# Patient Record
Sex: Female | Born: 1960 | Race: White | Hispanic: No | Marital: Married | Smoking: Never smoker
Health system: Southern US, Community
[De-identification: ages and names within clinical notes are randomized; demographics above are authoritative.]

## PROBLEM LIST (undated history)

## (undated) DIAGNOSIS — E785 Hyperlipidemia, unspecified: Secondary | ICD-10-CM

## (undated) DIAGNOSIS — M25559 Pain in unspecified hip: Secondary | ICD-10-CM

## (undated) DIAGNOSIS — M549 Dorsalgia, unspecified: Secondary | ICD-10-CM

## (undated) HISTORY — PX: CHOLECYSTECTOMY: SHX55

## (undated) HISTORY — DX: Dorsalgia, unspecified: M54.9

## (undated) HISTORY — PX: HAND SURGERY: SHX662

## (undated) HISTORY — DX: Pain in unspecified hip: M25.559

---

## 2005-01-09 ENCOUNTER — Ambulatory Visit: Payer: Self-pay

## 2015-11-06 LAB — HEPATIC FUNCTION PANEL
ALK PHOS: 62 U/L (ref 25–125)
ALT: 17 U/L (ref 7–35)
AST: 22 U/L (ref 13–35)
BILIRUBIN, TOTAL: 0.3 mg/dL

## 2015-11-06 LAB — LIPID PANEL
Cholesterol: 191 mg/dL (ref 0–200)
HDL: 60 mg/dL (ref 35–70)
LDL CALC: 113 mg/dL
Triglycerides: 92 mg/dL (ref 40–160)

## 2015-11-06 LAB — BASIC METABOLIC PANEL
BUN: 13 mg/dL (ref 4–21)
CREATININE: 0.9 mg/dL (ref 0.5–1.1)
GLUCOSE: 87 mg/dL
Potassium: 4 mmol/L (ref 3.4–5.3)
SODIUM: 141 mmol/L (ref 137–147)

## 2015-11-06 LAB — CBC AND DIFFERENTIAL
HCT: 41 % (ref 36–46)
Hemoglobin: 14.5 g/dL (ref 12.0–16.0)
Neutrophils Absolute: 5 /uL
Platelets: 270 10*3/uL (ref 150–399)
WBC: 7.4 10^3/mL

## 2015-11-06 LAB — TSH: TSH: 2.05 u[IU]/mL (ref 0.41–5.90)

## 2015-11-06 LAB — HEMOGLOBIN A1C: Hemoglobin A1C: 5.5

## 2015-11-22 LAB — HM MAMMOGRAPHY

## 2016-04-07 ENCOUNTER — Encounter: Payer: Self-pay | Admitting: Family Medicine

## 2016-04-07 ENCOUNTER — Ambulatory Visit (INDEPENDENT_AMBULATORY_CARE_PROVIDER_SITE_OTHER): Payer: 59 | Admitting: Family Medicine

## 2016-04-07 VITALS — BP 104/64 | HR 68 | Temp 98.1°F | Ht 65.0 in | Wt 141.2 lb

## 2016-04-07 DIAGNOSIS — Z0001 Encounter for general adult medical examination with abnormal findings: Secondary | ICD-10-CM

## 2016-04-07 DIAGNOSIS — M25551 Pain in right hip: Secondary | ICD-10-CM | POA: Diagnosis not present

## 2016-04-07 DIAGNOSIS — R6889 Other general symptoms and signs: Secondary | ICD-10-CM | POA: Diagnosis not present

## 2016-04-07 MED ORDER — PREDNISONE 10 MG (48) PO TBPK: ORAL_TABLET | Freq: Every day | ORAL | 0 refills | Status: DC

## 2016-04-07 NOTE — Progress Notes (Signed)
Pre visit review using our clinic review tool, if applicable. No additional management support is needed unless otherwise documented below in the visit note. 

## 2016-04-07 NOTE — Patient Instructions (Signed)
Follow up with me if it persists. Otherwise follow up annually.  It was nice to see you today.  Take care  Dr. Lacinda Axon  Health Maintenance, Female Adopting a healthy lifestyle and getting preventive care can go a long way to promote health and wellness. Talk with your health care provider about what schedule of regular examinations is right for you. This is a good chance for you to check in with your provider about disease prevention and staying healthy. In between checkups, there are plenty of things you can do on your own. Experts have done a lot of research about which lifestyle changes and preventive measures are most likely to keep you healthy. Ask your health care provider for more information. WEIGHT AND DIET  Eat a healthy diet  Be sure to include plenty of vegetables, fruits, low-fat dairy products, and lean protein.  Do not eat a lot of foods high in solid fats, added sugars, or salt.  Get regular exercise. This is one of the most important things you can do for your health.  Most adults should exercise for at least 150 minutes each week. The exercise should increase your heart rate and make you sweat (moderate-intensity exercise).  Most adults should also do strengthening exercises at least twice a week. This is in addition to the moderate-intensity exercise.  Maintain a healthy weight  Body mass index (BMI) is a measurement that can be used to identify possible weight problems. It estimates body fat based on height and weight. Your health care provider can help determine your BMI and help you achieve or maintain a healthy weight.  For females 12 years of age and older:   A BMI below 18.5 is considered underweight.  A BMI of 18.5 to 24.9 is normal.  A BMI of 25 to 29.9 is considered overweight.  A BMI of 30 and above is considered obese.  Watch levels of cholesterol and blood lipids  You should start having your blood tested for lipids and cholesterol at 55 years of  age, then have this test every 5 years.  You may need to have your cholesterol levels checked more often if:  Your lipid or cholesterol levels are high.  You are older than 56 years of age.  You are at high risk for heart disease.  CANCER SCREENING   Lung Cancer  Lung cancer screening is recommended for adults 94-32 years old who are at high risk for lung cancer because of a history of smoking.  A yearly low-dose CT scan of the lungs is recommended for people who:  Currently smoke.  Have quit within the past 15 years.  Have at least a 30-pack-year history of smoking. A pack year is smoking an average of one pack of cigarettes a day for 1 year.  Yearly screening should continue until it has been 15 years since you quit.  Yearly screening should stop if you develop a health problem that would prevent you from having lung cancer treatment.  Breast Cancer  Practice breast self-awareness. This means understanding how your breasts normally appear and feel.  It also means doing regular breast self-exams. Let your health care provider know about any changes, no matter how small.  If you are in your 20s or 30s, you should have a clinical breast exam (CBE) by a health care provider every 1-3 years as part of a regular health exam.  If you are 49 or older, have a CBE every year. Also consider having a breast  X-ray (mammogram) every year.  If you have a family history of breast cancer, talk to your health care provider about genetic screening.  If you are at high risk for breast cancer, talk to your health care provider about having an MRI and a mammogram every year.  Breast cancer gene (BRCA) assessment is recommended for women who have family members with BRCA-related cancers. BRCA-related cancers include:  Breast.  Ovarian.  Tubal.  Peritoneal cancers.  Results of the assessment will determine the need for genetic counseling and BRCA1 and BRCA2 testing. Cervical  Cancer Your health care provider may recommend that you be screened regularly for cancer of the pelvic organs (ovaries, uterus, and vagina). This screening involves a pelvic examination, including checking for microscopic changes to the surface of your cervix (Pap test). You may be encouraged to have this screening done every 3 years, beginning at age 59.  For women ages 49-65, health care providers may recommend pelvic exams and Pap testing every 3 years, or they may recommend the Pap and pelvic exam, combined with testing for human papilloma virus (HPV), every 5 years. Some types of HPV increase your risk of cervical cancer. Testing for HPV may also be done on women of any age with unclear Pap test results.  Other health care providers may not recommend any screening for nonpregnant women who are considered low risk for pelvic cancer and who do not have symptoms. Ask your health care provider if a screening pelvic exam is right for you.  If you have had past treatment for cervical cancer or a condition that could lead to cancer, you need Pap tests and screening for cancer for at least 20 years after your treatment. If Pap tests have been discontinued, your risk factors (such as having a new sexual partner) need to be reassessed to determine if screening should resume. Some women have medical problems that increase the chance of getting cervical cancer. In these cases, your health care provider may recommend more frequent screening and Pap tests. Colorectal Cancer  This type of cancer can be detected and often prevented.  Routine colorectal cancer screening usually begins at 55 years of age and continues through 55 years of age.  Your health care provider may recommend screening at an earlier age if you have risk factors for colon cancer.  Your health care provider may also recommend using home test kits to check for hidden blood in the stool.  A small camera at the end of a tube can be used to  examine your colon directly (sigmoidoscopy or colonoscopy). This is done to check for the earliest forms of colorectal cancer.  Routine screening usually begins at age 12.  Direct examination of the colon should be repeated every 5-10 years through 55 years of age. However, you may need to be screened more often if early forms of precancerous polyps or small growths are found. Skin Cancer  Check your skin from head to toe regularly.  Tell your health care provider about any new moles or changes in moles, especially if there is a change in a mole's shape or color.  Also tell your health care provider if you have a mole that is larger than the size of a pencil eraser.  Always use sunscreen. Apply sunscreen liberally and repeatedly throughout the day.  Protect yourself by wearing long sleeves, pants, a wide-brimmed hat, and sunglasses whenever you are outside. HEART DISEASE, DIABETES, AND HIGH BLOOD PRESSURE   High blood pressure causes heart  disease and increases the risk of stroke. High blood pressure is more likely to develop in:  People who have blood pressure in the high end of the normal range (130-139/85-89 mm Hg).  People who are overweight or obese.  People who are African American.  If you are 46-25 years of age, have your blood pressure checked every 3-5 years. If you are 69 years of age or older, have your blood pressure checked every year. You should have your blood pressure measured twice--once when you are at a hospital or clinic, and once when you are not at a hospital or clinic. Record the average of the two measurements. To check your blood pressure when you are not at a hospital or clinic, you can use:  An automated blood pressure machine at a pharmacy.  A home blood pressure monitor.  If you are between 82 years and 75 years old, ask your health care provider if you should take aspirin to prevent strokes.  Have regular diabetes screenings. This involves taking a  blood sample to check your fasting blood sugar level.  If you are at a normal weight and have a low risk for diabetes, have this test once every three years after 55 years of age.  If you are overweight and have a high risk for diabetes, consider being tested at a younger age or more often. PREVENTING INFECTION  Hepatitis B  If you have a higher risk for hepatitis B, you should be screened for this virus. You are considered at high risk for hepatitis B if:  You were born in a country where hepatitis B is common. Ask your health care provider which countries are considered high risk.  Your parents were born in a high-risk country, and you have not been immunized against hepatitis B (hepatitis B vaccine).  You have HIV or AIDS.  You use needles to inject street drugs.  You live with someone who has hepatitis B.  You have had sex with someone who has hepatitis B.  You get hemodialysis treatment.  You take certain medicines for conditions, including cancer, organ transplantation, and autoimmune conditions. Hepatitis C  Blood testing is recommended for:  Everyone born from [birth year] through 1965.  Anyone with known risk factors for hepatitis C. Sexually transmitted infections (STIs)  You should be screened for sexually transmitted infections (STIs) including gonorrhea and chlamydia if:  You are sexually active and are younger than 55 years of age.  You are older than 55 years of age and your health care provider tells you that you are at risk for this type of infection.  Your sexual activity has changed since you were last screened and you are at an increased risk for chlamydia or gonorrhea. Ask your health care provider if you are at risk.  If you do not have HIV, but are at risk, it may be recommended that you take a prescription medicine daily to prevent HIV infection. This is called pre-exposure prophylaxis (PrEP). You are considered at risk if:  You are sexually active and do  not regularly use condoms or know the HIV status of your partner(s).  You take drugs by injection.  You are sexually active with a partner who has HIV. Talk with your health care provider about whether you are at high risk of being infected with HIV. If you choose to begin PrEP, you should first be tested for HIV. You should then be tested every 3 months for as long as you are taking  PrEP.  PREGNANCY   If you are premenopausal and you may become pregnant, ask your health care provider about preconception counseling.  If you may become pregnant, take 400 to 800 micrograms (mcg) of folic acid every day.  If you want to prevent pregnancy, talk to your health care provider about birth control (contraception). OSTEOPOROSIS AND MENOPAUSE   Osteoporosis is a disease in which the bones lose minerals and strength with aging. This can result in serious bone fractures. Your risk for osteoporosis can be identified using a bone density scan.  If you are 12 years of age or older, or if you are at risk for osteoporosis and fractures, ask your health care provider if you should be screened.  Ask your health care provider whether you should take a calcium or vitamin D supplement to lower your risk for osteoporosis.  Menopause may have certain physical symptoms and risks.  Hormone replacement therapy may reduce some of these symptoms and risks. Talk to your health care provider about whether hormone replacement therapy is right for you.  HOME CARE INSTRUCTIONS   Schedule regular health, dental, and eye exams.  Stay current with your immunizations.   Do not use any tobacco products including cigarettes, chewing tobacco, or electronic cigarettes.  If you are pregnant, do not drink alcohol.  If you are breastfeeding, limit how much and how often you drink alcohol.  Limit alcohol intake to no more than 1 drink per day for nonpregnant women. One drink equals 12 ounces of beer, 5 ounces of wine, or 1  ounces of hard liquor.  Do not use street drugs.  Do not share needles.  Ask your health care provider for help if you need support or information about quitting drugs.  Tell your health care provider if you often feel depressed.  Tell your health care provider if you have ever been abused or do not feel safe at home.   This information is not intended to replace advice given to you by your health care provider. Make sure you discuss any questions you have with your health care provider.   Document Released: 03/10/2011 Document Revised: 09/15/2014 Document Reviewed: 07/27/2013 Elsevier Interactive Patient Education [employer].

## 2016-04-07 NOTE — Progress Notes (Signed)
Subjective:  Patient ID: Taylor Fisher, female    DOB: 1960/12/22  Age: 55 y.o. MRN: [id number]  CC: Establish care, Hip pain  HPI Taylor Fisher is a 55 y.o. female presents to the clinic today to establish care.  Preventative Healthcare  Pap smear: Up to date.   Mammogram: Up to date.  Colonoscopy: Up to date.   Immunizations  Tetanus - In need of. Wants to wait.  Hepatitis C screening - Candidate for.  Labs: Has had annual labs at GYN. Will request records.  Exercise: Works outside.  Alcohol use: See below.  Smoking/tobacco use: Nonsmoker.  STD/HIV testing: Candidate for.  Regular dental exams: Yes.   Wears seat belt: Yes.   R Hip pain  Located laterally.  Does have tenderness over the trochanter (mild).  Has been persistent for the past 3 months.  No reported fall, trauma, injury.  No groin pain.  Pain is mild to severe at times.  Described as a dull ache.  Worse at night.  No relief with visits to the chiropractor, Aleve, BC powder.  No reports of back pain or radicular symptoms.  PMH, Surgical Hx, Family Hx, Social History reviewed and updated as below.  Past Medical History:  Diagnosis Date  . Back pain   . Hip pain    Past Surgical History:  Procedure Laterality Date  . CESAREAN SECTION     x 2  . CHOLECYSTECTOMY     Family History  Problem Relation Age of Onset  . Kidney Stones Father   . Colon cancer Maternal Grandfather    Social History  Substance Use Topics  . Smoking status: Never Smoker  . Smokeless tobacco: Never Used  . Alcohol use Yes     Comment: occassionally   Review of Systems  Musculoskeletal:       Right hip pain.  All other systems reviewed and are negative.  Objective:   Today's Vitals: BP 104/64   Pulse 68   Temp 98.1 F (36.7 C) (Oral)   Ht 5\' 5"  (1.651 m)   Wt 141 lb 4 oz (64.1 kg)   LMP  (LMP Unknown)   SpO2 98%   BMI 23.51 kg/m   Physical Exam  Constitutional: She is oriented to  person, place, and time. She appears well-developed and well-nourished. No distress.  HENT:  Head: Normocephalic and atraumatic.  Nose: Nose normal.  Mouth/Throat: Oropharynx is clear and moist. No oropharyngeal exudate.  Normal TM's bilaterally.   Eyes: Conjunctivae are normal. No scleral icterus.  Neck: Neck supple. No thyromegaly present.  Cardiovascular: Normal rate and regular rhythm.   No murmur heard. Pulmonary/Chest: Effort normal and breath sounds normal. She has no wheezes. She has no rales.  Abdominal: Soft. She exhibits no distension. There is no tenderness. There is no rebound and no guarding.  Musculoskeletal:  Hip: Right ROM - Slight limitation in external rotation. Greater trochanter with mild tenderness.  No pain with FABER or FADIR.  Negative straight leg raise.  Right hand - Only thumb is present. Patient had lawnmower accident and lost digits.  Lymphadenopathy:    She has no cervical adenopathy.  Neurological: She is alert and oriented to person, place, and time.  Skin: Skin is warm and dry. No rash noted.  Psychiatric: She has a normal mood and affect.  Vitals reviewed.  Assessment & Plan:   Problem List Items Addressed This Visit    Encounter for preventative adult health care exam with abnormal findings  Pap smear mammogram up-to-date. Colonoscopy up-to-date. Patient wants to wait on Tdap. We'll request records from OB/GYN regarding labs.      Right hip pain    New problem. Appears to be MSK in nature; possible mild trochanteric bursitis. Discussed treatment options today including NSAIDs, oral steroids, injection. Will treat with course of prednisone. Follow up if persists.       Other Visit Diagnoses   None.     Outpatient Encounter Prescriptions as of 04/07/2016  Medication Sig  . aspirin 81 MG tablet Take 81 mg by mouth daily.  . Calcium Carbonate-Vitamin D (CALTRATE 600+D PO) Take by mouth daily.  . ferrous sulfate 325 (65 FE) MG  tablet Take 325 mg by mouth daily with breakfast.  . Multiple Vitamins-Minerals (CENTRUM SILVER PO) Take by mouth daily.  . norethindrone-ethinyl estradiol (FEMHRT LOW DOSE) 0.5-2.5 MG-MCG tablet Take 1 tablet by mouth daily.  . Omega-3 Fatty Acids (FISH OIL) 1200 MG CAPS Take by mouth daily.  . vitamin B-12 (CYANOCOBALAMIN) 500 MCG tablet Take 500 mcg by mouth daily.  . predniSONE (STERAPRED UNI-PAK 48 TAB) 10 MG (48) TBPK tablet Take by mouth daily. Per package instructions.   No facility-administered encounter medications on file as of 04/07/2016.     Follow-up: Annually or sooner if needed.  Everlene Other DO [facility]

## 2016-04-07 NOTE — Assessment & Plan Note (Signed)
Pap smear mammogram up-to-date. Colonoscopy up-to-date. Patient wants to wait on Tdap. We'll request records from OB/GYN regarding labs.

## 2016-04-07 NOTE — Assessment & Plan Note (Signed)
New problem. Appears to be MSK in nature; possible mild trochanteric bursitis. Discussed treatment options today including NSAIDs, oral steroids, injection. Will treat with course of prednisone. Follow up if persists.

## 2016-04-10 ENCOUNTER — Telehealth: Payer: Self-pay | Admitting: *Deleted

## 2016-04-10 NOTE — Telephone Encounter (Signed)
Order states take one per day, follow package instructions, please advise is this correct prior to me calling patient, thanks

## 2016-04-10 NOTE — Telephone Encounter (Signed)
Advised patient left a detailed message, thanks

## 2016-04-10 NOTE — Telephone Encounter (Signed)
This is a sterpred pack. Take per package instructions.

## 2016-04-10 NOTE — Telephone Encounter (Signed)
Patient stated that her prednisone Rx had a different direction than what Dr Adriana Simas advised. She requested a call to clarify her dosage for this medication. She was advised to take one tablet daily.  Pt contact [phone]  A message can be left on voicemail

## 2016-04-17 ENCOUNTER — Ambulatory Visit (INDEPENDENT_AMBULATORY_CARE_PROVIDER_SITE_OTHER): Payer: 59 | Admitting: Family Medicine

## 2016-04-17 ENCOUNTER — Encounter (INDEPENDENT_AMBULATORY_CARE_PROVIDER_SITE_OTHER): Payer: Self-pay

## 2016-04-17 ENCOUNTER — Encounter: Payer: Self-pay | Admitting: Family Medicine

## 2016-04-17 DIAGNOSIS — M25551 Pain in right hip: Secondary | ICD-10-CM | POA: Diagnosis not present

## 2016-04-17 DIAGNOSIS — R5383 Other fatigue: Secondary | ICD-10-CM | POA: Diagnosis not present

## 2016-04-17 DIAGNOSIS — F329 Major depressive disorder, single episode, unspecified: Secondary | ICD-10-CM

## 2016-04-17 NOTE — Assessment & Plan Note (Signed)
New problem. Appears to be secondary to underlying mild depression. Offered medication and patient declined. Will continue to monitor closely.

## 2016-04-17 NOTE — Assessment & Plan Note (Signed)
Established problem, improving. Advised to finish course of prednisone. Exercises given today. Follow up as needed. If persists, will proceed with injection.

## 2016-04-17 NOTE — Patient Instructions (Signed)
If your hip continues to bother you let me know.  Follow up annually or sooner if needed.  Take care  Dr. Adriana Simas   Generic Hip Exercises RANGE OF MOTION (ROM) AND STRETCHING EXERCISES  These exercises may help you when beginning to rehabilitate your injury. Doing them too aggressively can worsen your condition. Complete them slowly and gently. Your symptoms may resolve with or without further involvement from your physician, physical therapist or athletic trainer. While completing these exercises, remember:   Restoring tissue flexibility helps normal motion to return to the joints. This allows healthier, less painful movement and activity.  An effective stretch should be held for at least 30 seconds.  A stretch should never be painful. You should only feel a gentle lengthening or release in the stretched tissue. If these stretches worsen your symptoms even when done gently, consult your physician, physical therapist or athletic trainer. STRETCH - Hamstrings, Supine   Lie on your back. Loop a belt or towel over the ball of your right / left foot.  Straighten your right / left knee and slowly pull on the belt to raise your leg. Do not allow the right / left knee to bend. Keep your opposite leg flat on the floor.  Raise the leg until you feel a gentle stretch behind your right / left knee or thigh. Hold this position for __________ seconds. Repeat __________ times. Complete this stretch __________ times per day.  STRETCH - Hip Rotators   Lie on your back on a firm surface. Grasp your right / left knee with your right / left hand and your ankle with your opposite hand.  Keeping your hips and shoulders firmly planted, gently pull your right / left knee and rotate your lower leg toward your opposite shoulder until you feel a stretch in your buttocks.  Hold this stretch for __________ seconds. Repeat this stretch __________ times. Complete this stretch __________ times per day. STRETCH -  Hamstrings/Adductors, V-Sit   Sit on the floor with your legs extended in a large "V," keeping your knees straight.  With your head and chest upright, bend at your waist reaching for your right foot to stretch your left adductors.  You should feel a stretch in your left inner thigh. Hold for __________ seconds.  Return to the upright position to relax your leg muscles.  Continuing to keep your chest upright, bend straight forward at your waist to stretch your hamstrings.  You should feel a stretch behind both of your thighs and/or knees. Hold for __________ seconds.  Return to the upright position to relax your leg muscles.  Repeat steps 2 through 4 for opposite leg. Repeat __________ times. Complete this exercise __________ times per day.  STRETCHING - Hip Flexors, Lunge  Half kneel with your right / left knee on the floor and your opposite knee bent and directly over your ankle.  Keep good posture with your head over your shoulders. Tighten your buttocks to point your tailbone downward; this will prevent your back from arching too much.  You should feel a gentle stretch in the front of your thigh and/or hip. If you do not feel any resistance, slightly slide your opposite foot forward and then slowly lunge forward so your knee once again lines up over your ankle. Be sure your tailbone remains pointed downward.  Hold this stretch for __________ seconds. Repeat __________ times. Complete this stretch __________ times per day. STRENGTHENING EXERCISES These exercises may help you when beginning to rehabilitate your  injury. They may resolve your symptoms with or without further involvement from your physician, physical therapist or athletic trainer. While completing these exercises, remember:   Muscles can gain both the endurance and the strength needed for everyday activities through controlled exercises.  Complete these exercises as instructed by your physician, physical therapist or  athletic trainer. Progress the resistance and repetitions only as guided.  You may experience muscle soreness or fatigue, but the pain or discomfort you are trying to eliminate should never worsen during these exercises. If this pain does worsen, stop and make certain you are following the directions exactly. If the pain is still present after adjustments, discontinue the exercise until you can discuss the trouble with your clinician. STRENGTH - Hip Extensors, Bridge   Lie on your back on a firm surface. Bend your knees and place your feet flat on the floor.  Tighten your buttocks muscles and lift your bottom off the floor until your trunk is level with your thighs. You should feel the muscles in your buttocks and back of your thighs working. If you do not feel these muscles, slide your feet 1-2 inches further away from your buttocks.  Hold this position for __________ seconds.  Slowly lower your hips to the starting position and allow your buttock muscles relax completely before beginning the next repetition.  If this exercise is too easy, you may cross your arms over your chest. Repeat __________ times. Complete this exercise __________ times per day.  STRENGTH - Hip Abductors, Straight Leg Raises  Be aware of your form throughout the entire exercise so that you exercise the correct muscles. Sloppy form means that you are not strengthening the correct muscles.  Lie on your side so that your head, shoulders, knee and hip line up. You may bend your lower knee to help maintain your balance. Your right / left leg should be on top.  Roll your hips slightly forward, so that your hips are stacked directly over each other and your right / left knee is facing forward.  Lift your top leg up 4-6 inches, leading with your heel. Be sure that your foot does not drift forward or that your knee does not roll toward the ceiling.  Hold this position for __________ seconds. You should feel the muscles in your  outer hip lifting (you may not notice this until your leg begins to tire).  Slowly lower your leg to the starting position. Allow the muscles to fully relax before beginning the next repetition. Repeat __________ times. Complete this exercise __________ times per day.  STRENGTH - Hip Adductors, Straight Leg Raises   Lie on your side so that your head, shoulders, knee and hip line up. You may place your upper foot in front to help maintain your balance. Your right / left leg should be on the bottom.  Roll your hips slightly forward, so that your hips are stacked directly over each other and your right / left knee is facing forward.  Tense the muscles in your inner thigh and lift your bottom leg 4-6 inches. Hold this position for __________ seconds.  Slowly lower your leg to the starting position. Allow the muscles to fully relax before beginning the next repetition. Repeat __________ times. Complete this exercise __________ times per day.  STRENGTH - Quadriceps, Straight Leg Raises  Quality counts! Watch for signs that the quadriceps muscle is working to insure you are strengthening the correct muscles and not "cheating" by substituting with healthier muscles.  Lay  on your back with your right / left leg extended and your opposite knee bent.  Tense the muscles in the front of your right / left thigh. You should see either your knee cap slide up or increased dimpling just above the knee. Your thigh may even quiver.  Tighten these muscles even more and raise your leg 4 to 6 inches off the floor. Hold for right / left seconds.  Keeping these muscles tense, lower your leg.  Relax the muscles slowly and completely in between each repetition. Repeat __________ times. Complete this exercise __________ times per day.  STRENGTH - Hip Abductors, Standing  Tie one end of a rubber exercise band/tubing to a secure surface (table, pole) and tie a loop at the other end.  Place the loop around your  right / left ankle. Keeping your ankle with the band directly opposite of the secured end, step away until there is tension in the tube/band.  Hold onto a chair as needed for balance.  Keeping your back upright, your shoulders over your hips, and your toes pointing forward, lift your right / left leg out to your side. Be sure to lift your leg with your hip muscles. Do not "throw" your leg or tip your body to lift your leg.  Slowly and with control, return to the starting position. Repeat exercise __________ times. Complete this exercise __________ times per day.  STRENGTH - Quadriceps, Squats  Stand in a door frame so that your feet and knees are in line with the frame.  Use your hands for balance, not support, on the frame.  Slowly lower your weight, bending at the hips and knees. Keep your lower legs upright so that they are parallel with the door frame. Squat only within the range that does not increase your knee pain. Never let your hips drop below your knees.  Slowly return upright, pushing with your legs, not pulling with your hands.   This information is not intended to replace advice given to you by your health care provider. Make sure you discuss any questions you have with your health care provider.   Document Released: 09/12/2005 Document Revised: 09/15/2014 Document Reviewed: 12/07/2008 Elsevier Interactive Patient Education [employer]

## 2016-04-17 NOTE — Progress Notes (Signed)
Pre visit review using our clinic review tool, if applicable. No additional management support is needed unless otherwise documented below in the visit note. 

## 2016-04-17 NOTE — Progress Notes (Signed)
   Subjective:  Patient ID: Taylor Fisher, female    DOB: [birth year]  Age: 55 y.o. MRN: [id number]  CC: R hip pain  HPI:  55 year old female presents for follow-up regarding her right hip pain.  Patient reports that her right hip pain has improved but is still mildly troublesome. Pain is improved dramatically since being on the prednisone. Pain is located at the greater trochanter. No known exacerbating factors.  Additionally, patient reports she has been experiencing fatigue and decreased energy. This is been going on for quite some time. She is concerned about this and would like to discuss it further today. She has noticed an improvement in her energy level since taking the prednisone. No new stressors or life events, although she has lost family members in the recent past. No issues at home. She tries to be active but does not exercise regularly. No other complaints this time.  Social Hx   Social History   Social History  . Marital status: Married    Spouse name: N/A  . Number of children: N/A  . Years of education: N/A   Social History Main Topics  . Smoking status: Never Smoker  . Smokeless tobacco: Never Used  . Alcohol use Yes     Comment: occassionally  . Drug use: Unknown  . Sexual activity: Not Asked   Other Topics Concern  . None   Social History Narrative  . None   Review of Systems  Constitutional: Positive for fatigue.  Musculoskeletal:       Right hip pain.    Objective:  BP 133/80 (BP Location: Right Arm, Patient Position: Sitting, Cuff Size: Normal)   Pulse (!) 110   Temp 98.1 F (36.7 C) (Oral)   Wt 141 lb 8 oz (64.2 kg)   LMP  (LMP Unknown)   SpO2 96%   BMI 23.55 kg/m   BP/Weight 04/17/2016 04/07/2016  Systolic BP 133 104  Diastolic BP 80 64  Wt. (Lbs) 141.5 141.25  BMI 23.55 23.51   Physical Exam  Constitutional: She is oriented to person, place, and time. She appears well-developed. No distress.  Pulmonary/Chest: Effort normal.    Musculoskeletal:  Right hip - Mild tenderness at the greater trochanter.  Neurological: She is alert and oriented to person, place, and time.  Psychiatric: She has a normal mood and affect.  Vitals reviewed.  Assessment & Plan:   Problem List Items Addressed This Visit    Fatigue    New problem. Appears to be secondary to underlying mild depression. Offered medication and patient declined. Will continue to monitor closely.      Right hip pain    Established problem, improving. Advised to finish course of prednisone. Exercises given today. Follow up as needed. If persists, will proceed with injection.       Other Visit Diagnoses   None.    Follow-up: PRN  Everlene OtherJayce Christobal Morado DO [facility] [place]

## 2016-05-08 ENCOUNTER — Encounter: Payer: Self-pay | Admitting: Family Medicine

## 2017-11-11 ENCOUNTER — Other Ambulatory Visit: Payer: Self-pay | Admitting: Obstetrics & Gynecology

## 2017-11-11 DIAGNOSIS — Z1231 Encounter for screening mammogram for malignant neoplasm of breast: Secondary | ICD-10-CM

## 2017-12-29 ENCOUNTER — Ambulatory Visit
Admission: RE | Admit: 2017-12-29 | Discharge: 2017-12-29 | Disposition: A | Discharge status: Home / Self Care | Payer: 59 | Source: Ambulatory Visit | Attending: Obstetrics & Gynecology | Admitting: Obstetrics & Gynecology

## 2017-12-29 ENCOUNTER — Encounter: Payer: Self-pay | Admitting: Radiology

## 2017-12-29 DIAGNOSIS — Z1231 Encounter for screening mammogram for malignant neoplasm of breast: Secondary | ICD-10-CM | POA: Diagnosis not present

## 2018-11-16 ENCOUNTER — Other Ambulatory Visit: Payer: Self-pay | Admitting: Obstetrics & Gynecology

## 2018-11-16 DIAGNOSIS — Z1231 Encounter for screening mammogram for malignant neoplasm of breast: Secondary | ICD-10-CM

## 2019-03-01 ENCOUNTER — Ambulatory Visit
Admission: RE | Admit: 2019-03-01 | Discharge: 2019-03-01 | Disposition: A | Discharge status: Home / Self Care | Payer: 59 | Source: Ambulatory Visit | Attending: Obstetrics & Gynecology | Admitting: Obstetrics & Gynecology

## 2019-03-01 ENCOUNTER — Other Ambulatory Visit: Payer: Self-pay

## 2019-03-01 DIAGNOSIS — Z1231 Encounter for screening mammogram for malignant neoplasm of breast: Secondary | ICD-10-CM | POA: Diagnosis present

## 2020-01-12 DIAGNOSIS — M21942 Unspecified acquired deformity of hand, left hand: Secondary | ICD-10-CM | POA: Insufficient documentation

## 2020-01-12 DIAGNOSIS — Z1159 Encounter for screening for other viral diseases: Secondary | ICD-10-CM | POA: Insufficient documentation

## 2020-03-27 ENCOUNTER — Other Ambulatory Visit: Payer: Self-pay | Admitting: Obstetrics & Gynecology

## 2020-03-27 DIAGNOSIS — Z1231 Encounter for screening mammogram for malignant neoplasm of breast: Secondary | ICD-10-CM

## 2020-04-17 ENCOUNTER — Other Ambulatory Visit: Payer: Self-pay

## 2020-04-17 ENCOUNTER — Ambulatory Visit
Admission: RE | Admit: 2020-04-17 | Discharge: 2020-04-17 | Disposition: A | Discharge status: Home / Self Care | Payer: 59 | Source: Ambulatory Visit | Attending: Obstetrics & Gynecology | Admitting: Obstetrics & Gynecology

## 2020-04-17 DIAGNOSIS — Z1231 Encounter for screening mammogram for malignant neoplasm of breast: Secondary | ICD-10-CM | POA: Insufficient documentation

## 2020-04-20 ENCOUNTER — Other Ambulatory Visit: Payer: Self-pay | Admitting: Obstetrics & Gynecology

## 2020-04-20 DIAGNOSIS — R928 Other abnormal and inconclusive findings on diagnostic imaging of breast: Secondary | ICD-10-CM

## 2020-05-03 ENCOUNTER — Other Ambulatory Visit: Payer: Self-pay

## 2020-05-03 ENCOUNTER — Ambulatory Visit
Admission: RE | Admit: 2020-05-03 | Discharge: 2020-05-03 | Disposition: A | Discharge status: Home / Self Care | Payer: 59 | Source: Ambulatory Visit | Attending: Obstetrics & Gynecology | Admitting: Obstetrics & Gynecology

## 2020-05-03 DIAGNOSIS — R928 Other abnormal and inconclusive findings on diagnostic imaging of breast: Secondary | ICD-10-CM | POA: Diagnosis not present

## 2020-12-27 ENCOUNTER — Encounter: Payer: Self-pay | Admitting: Gastroenterology

## 2020-12-28 ENCOUNTER — Other Ambulatory Visit: Payer: Self-pay

## 2021-02-11 ENCOUNTER — Encounter: Payer: Self-pay | Admitting: Gastroenterology

## 2021-02-11 ENCOUNTER — Other Ambulatory Visit: Payer: Self-pay

## 2021-02-11 ENCOUNTER — Ambulatory Visit (INDEPENDENT_AMBULATORY_CARE_PROVIDER_SITE_OTHER): Payer: 59 | Admitting: Gastroenterology

## 2021-02-11 VITALS — BP 120/57 | HR 86 | Temp 97.9°F | Ht 65.0 in | Wt 141.2 lb

## 2021-02-11 DIAGNOSIS — R195 Other fecal abnormalities: Secondary | ICD-10-CM

## 2021-02-11 MED ORDER — NA SULFATE-K SULFATE-MG SULF 17.5-3.13-1.6 GM/177ML PO SOLN: 354.0000 mL | Freq: Once | ORAL | 0 refills | Status: AC

## 2021-02-11 NOTE — Progress Notes (Signed)
Taylor Repress, MD [address]  [address]  [place], [place] [address]  Main: [phone]  Fax: [phone]    Gastroenterology Consultation  Referring Provider:     Fayrene Helper, NP Primary Care Physician:  Ward, Elenora Fender, MD Primary Gastroenterologist:  Dr. Arlyss Fisher Reason for Consultation:    Hemoccult positive stool        HPI:   Taylor Fisher is a 60 y.o. female referred by Dr. Elesa Massed, Elenora Fender, MD  for consultation & management of Hemoccult positive stool.  Patient is referred to me due to heme positive stool.  Patient reports that she went to see her PCP in March after she had what she thought was a stomach bug.  She had Hemoccult test as part of physical and she is referred positive result.  Patient said that she was advised to repeat the stool test again, she chose to be evaluated by GI.  Patient denies any GI symptoms.  She reports that she had a colonoscopy at age 76 and was advised to repeat in 10 years, due in January 2023  Patient does not smoke or drink alcohol She denies family history of GI malignancy  She stays very active in farming  NSAIDs: None  Antiplts/Anticoagulants/Anti thrombotics: None  GI Procedures: Colonoscopy at age 52, reportedly normal  Past Medical History:  Diagnosis Date  . Back pain   . Hip pain     Past Surgical History:  Procedure Laterality Date  . CESAREAN SECTION     x 2  . CHOLECYSTECTOMY    . HAND SURGERY      Current Outpatient Medications:  .  aspirin 81 MG tablet, Take 81 mg by mouth daily., Disp: , Rfl:  .  Calcium Carbonate-Vitamin D 600-200 MG-UNIT TABS, Take 1 tablet by mouth daily., Disp: , Rfl:  .  Multiple Vitamins-Minerals (CENTRUM SILVER PO), Take by mouth daily., Disp: , Rfl:  .  Na Sulfate-K Sulfate-Mg Sulf 17.5-3.13-1.6 GM/177ML SOLN, Take 354 mLs by mouth once for 1 dose., Disp: 354 mL, Rfl: 0 .  Omega-3 Fatty Acids (FISH OIL) 1200 MG CAPS, Take by mouth daily., Disp: , Rfl:     Family History  Problem Relation Age of Onset  . Kidney Stones Father   . Colon cancer Maternal Grandfather   . Breast cancer Neg Hx      Social History   Tobacco Use  . Smoking status: Never Smoker  . Smokeless tobacco: Never Used  Substance Use Topics  . Alcohol use: Yes    Comment: occassionally    Allergies as of 02/11/2021  . (No Known Allergies)    Review of Systems:    All systems reviewed and negative except where noted in HPI.   Physical Exam:  BP (!) 120/57 (BP Location: Left Arm, Patient Position: Sitting, Cuff Size: Normal)   Pulse 86   Temp 97.9 F (36.6 C) (Oral)   Ht 5\' 5"  (1.651 m)   Wt 141 lb 4 oz (64.1 kg)   LMP  (LMP Unknown)   BMI 23.51 kg/m  No LMP recorded (lmp unknown). Patient is postmenopausal.  General:   Alert,  Well-developed, well-nourished, pleasant and cooperative in NAD Head:  Normocephalic and atraumatic. Eyes:  Sclera clear, no icterus.   Conjunctiva pink. Ears:  Normal auditory acuity. Nose:  No deformity, discharge, or lesions. Mouth:  No deformity or lesions,oropharynx pink & moist. Neck:  Supple; no masses or thyromegaly. Lungs:  Respirations even  and unlabored.  Clear throughout to auscultation.   No wheezes, crackles, or rhonchi. No acute distress. Heart:  Regular rate and rhythm; no murmurs, clicks, rubs, or gallops. Abdomen:  Normal bowel sounds. Soft, non-tender and non-distended without masses, hepatosplenomegaly or hernias noted.  No guarding or rebound tenderness.   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  Pulses:  Normal pulses noted. Extremities:  No clubbing or edema.  No cyanosis. Neurologic:  Alert and oriented x3;  grossly normal neurologically. Skin:  Intact without significant lesions or rashes. No jaundice. Psych:  Alert and cooperative. Normal mood and affect.  Imaging Studies: No recent  abdominal imaging  Assessment and Plan:   ALAISHA Fisher is a 60 y.o. female with no significant past medical history is seen in consultation for Hemoccult positive stool.  Patient does not have any GI symptoms.  I discussed with patient regarding diagnostic colonoscopy given Hemoccult positive stool and she is agreeable  I have discussed alternative options, risks & benefits,  which include, but are not limited to, bleeding, infection, perforation,respiratory complication & drug reaction.  The patient agrees with this plan & written consent will be obtained.     Follow up as needed   Taylor Repress, MD

## 2021-02-14 ENCOUNTER — Telehealth: Payer: Self-pay | Admitting: Gastroenterology

## 2021-02-14 NOTE — Telephone Encounter (Signed)
Patient states she is due for a screening colonoscopy in January and insurance would cover that 100 percent. She states she is not going to pay for colonoscopy for a diagnostic she would like to cancel. She states she wants the procedure to be change to a screening and she would have the procedure. Informed patient I could not do that because that would be insurance fraud. She states okay she understand and will cancel. Called Trish and got colonoscopy cancel   FYI

## 2021-02-14 NOTE — Telephone Encounter (Signed)
Patient has questions about procedure and coding for insurance, please call to advise

## 2021-02-19 ENCOUNTER — Ambulatory Visit: Admission: RE | Admit: 2021-02-19 | Payer: 59 | Source: Home / Self Care | Admitting: Gastroenterology

## 2021-02-19 ENCOUNTER — Encounter: Admission: RE | Payer: Self-pay | Source: Home / Self Care

## 2021-02-19 SURGERY — COLONOSCOPY WITH PROPOFOL
Anesthesia: General

## 2021-03-05 ENCOUNTER — Telehealth: Payer: Self-pay

## 2021-03-05 NOTE — Telephone Encounter (Signed)
Patient is calling because she states she got a bill for her new patient office visit which the provider was only in the room with her for 10 minutes she states. She states that the bill is 400 dollars and she thinks it the insurance and medical system is messed up.  Informed patient she can call the billing department and they can explain the bill in more detail. She states she should never made the referral from her PCP because her colonoscopy is now diagnostic and has to pay 3000 dollars and she is due for a screening in January. She states she has never had colon polyps removed. I told patient that every insurance plans charge different prices for of the visit and colonoscopy due to deductibles and place of service. She verbalized understanding

## 2021-03-05 NOTE — Telephone Encounter (Signed)
Pulled up patient account and looks like she has a bill from the office visit of [phone]

## 2021-04-16 ENCOUNTER — Other Ambulatory Visit: Payer: Self-pay | Admitting: Nurse Practitioner

## 2021-04-16 DIAGNOSIS — Z1231 Encounter for screening mammogram for malignant neoplasm of breast: Secondary | ICD-10-CM

## 2021-05-02 ENCOUNTER — Ambulatory Visit
Admission: RE | Admit: 2021-05-02 | Discharge: 2021-05-02 | Disposition: A | Discharge status: Home / Self Care | Payer: 59 | Source: Ambulatory Visit | Attending: Nurse Practitioner | Admitting: Nurse Practitioner

## 2021-05-02 ENCOUNTER — Other Ambulatory Visit: Payer: Self-pay

## 2021-05-02 DIAGNOSIS — Z1231 Encounter for screening mammogram for malignant neoplasm of breast: Secondary | ICD-10-CM | POA: Diagnosis present

## 2021-09-26 ENCOUNTER — Other Ambulatory Visit: Payer: Self-pay

## 2021-09-26 DIAGNOSIS — Z1211 Encounter for screening for malignant neoplasm of colon: Secondary | ICD-10-CM

## 2021-09-26 NOTE — Progress Notes (Signed)
Patient states she does not need prep she still has last prep and its still in date patient called and asked to be put back in schedule to have a colonoscopy so rescheduled her for 10/16/2021 sent new communications and new refferal

## 2021-10-15 ENCOUNTER — Encounter: Payer: Self-pay | Admitting: Gastroenterology

## 2021-10-16 ENCOUNTER — Encounter: Payer: Self-pay | Admitting: Gastroenterology

## 2021-10-16 ENCOUNTER — Ambulatory Visit
Admission: RE | Admit: 2021-10-16 | Discharge: 2021-10-16 | Disposition: A | Discharge status: Home / Self Care | Payer: 59 | Attending: Gastroenterology | Admitting: Gastroenterology

## 2021-10-16 ENCOUNTER — Encounter
Admission: RE | Disposition: A | Discharge status: Home / Self Care | Payer: Self-pay | Source: Home / Self Care | Attending: Gastroenterology

## 2021-10-16 ENCOUNTER — Ambulatory Visit: Payer: 59 | Admitting: Certified Registered"

## 2021-10-16 DIAGNOSIS — Z1211 Encounter for screening for malignant neoplasm of colon: Secondary | ICD-10-CM | POA: Insufficient documentation

## 2021-10-16 DIAGNOSIS — Z0000 Encounter for general adult medical examination without abnormal findings: Secondary | ICD-10-CM

## 2021-10-16 HISTORY — DX: Hyperlipidemia, unspecified: E78.5

## 2021-10-16 HISTORY — PX: COLONOSCOPY WITH PROPOFOL: SHX5780

## 2021-10-16 SURGERY — COLONOSCOPY WITH PROPOFOL
Anesthesia: General

## 2021-10-16 MED ORDER — PROPOFOL 500 MG/50ML IV EMUL
INTRAVENOUS | Status: DC | PRN
  Administered 2021-10-16: 150 ug/kg/min via INTRAVENOUS

## 2021-10-16 MED ORDER — PROPOFOL 10 MG/ML IV BOLUS
INTRAVENOUS | Status: AC
  Filled 2021-10-16: qty 20

## 2021-10-16 MED ORDER — PROPOFOL 10 MG/ML IV BOLUS
INTRAVENOUS | Status: DC | PRN
  Administered 2021-10-16: 10 mg via INTRAVENOUS
  Administered 2021-10-16: 20 mg via INTRAVENOUS
  Administered 2021-10-16: 50 mg via INTRAVENOUS

## 2021-10-16 MED ORDER — SODIUM CHLORIDE 0.9 % IV SOLN
INTRAVENOUS | Status: DC
  Administered 2021-10-16: 1000 mL via INTRAVENOUS

## 2021-10-16 MED ORDER — PROPOFOL 500 MG/50ML IV EMUL
INTRAVENOUS | Status: AC
  Filled 2021-10-16: qty 50

## 2021-10-16 MED ORDER — LIDOCAINE HCL (CARDIAC) PF 100 MG/5ML IV SOSY
PREFILLED_SYRINGE | INTRAVENOUS | Status: DC | PRN
Start: 2021-10-16 — End: 2021-10-16
  Administered 2021-10-16: 50 mg via INTRAVENOUS

## 2021-10-16 NOTE — Op Note (Signed)
[facility] Gastroenterology Patient Name: Taylor Fisher Procedure Date: 10/16/2021 8:22 AM MRN: [id number] Account #: [id number] Date of Birth: [birth year] Admit Type: Outpatient Age: 61 Room: [facility] ENDO ROOM 4 Gender: Female Note Status: Finalized Instrument Name: Nelda Marseille [id number] Procedure:             Colonoscopy Indications:           Screening for colorectal malignant neoplasm, Last                         colonoscopy 10 years ago Providers:             Toney Reil MD, MD Referring MD:          No Local Md, MD (Referring MD) Medicines:             General Anesthesia Complications:         No immediate complications. Estimated blood loss: None. Procedure:             Pre-Anesthesia Assessment:                        - Prior to the procedure, a History and Physical was                         performed, and patient medications and allergies were                         reviewed. The patient is competent. The risks and                         benefits of the procedure and the sedation options and                         risks were discussed with the patient. All questions                         were answered and informed consent was obtained.                         Patient identification and proposed procedure were                         verified by the physician, the nurse, the                         anesthesiologist, the anesthetist and the technician                         in the pre-procedure area in the procedure room in the                         endoscopy suite. Mental Status Examination: alert and                         oriented. Airway Examination: normal oropharyngeal                         airway and neck mobility. Respiratory Examination:  clear to auscultation. CV Examination: normal.                         Prophylactic Antibiotics: The patient does not require                         prophylactic antibiotics.  Prior Anticoagulants: The                         patient has taken no previous anticoagulant or                         antiplatelet agents. ASA Grade Assessment: II - A                         patient with mild systemic disease. After reviewing                         the risks and benefits, the patient was deemed in                         satisfactory condition to undergo the procedure. The                         anesthesia plan was to use general anesthesia.                         Immediately prior to administration of medications,                         the patient was re-assessed for adequacy to receive                         sedatives. The heart rate, respiratory rate, oxygen                         saturations, blood pressure, adequacy of pulmonary                         ventilation, and response to care were monitored                         throughout the procedure. The physical status of the                         patient was re-assessed after the procedure.                        After obtaining informed consent, the colonoscope was                         passed under direct vision. Throughout the procedure,                         the patient's blood pressure, pulse, and oxygen                         saturations were monitored continuously. The  Colonoscope was introduced through the anus and                         advanced to the the cecum, identified by appendiceal                         orifice and ileocecal valve. The colonoscopy was                         performed without difficulty. The patient tolerated                         the procedure well. The quality of the bowel                         preparation was evaluated using the BBPS [facility] Bowel                         Preparation Scale) with scores of: Right Colon = 3,                         Transverse Colon = 3 and Left Colon = 3 (entire mucosa                         seen well  with no residual staining, small fragments                         of stool or opaque liquid). The total BBPS score                         equals 9. Findings:      The perianal and digital rectal examinations were normal. Pertinent       negatives include normal sphincter tone and no palpable rectal lesions.      The entire examined colon appeared normal.      The retroflexed view of the distal rectum and anal verge was normal and       showed no anal or rectal abnormalities. Impression:            - The entire examined colon is normal.                        - The distal rectum and anal verge are normal on                         retroflexion view.                        - No specimens collected. Recommendation:        - Discharge patient to home (with escort).                        - Resume regular diet today.                        - Continue present medications.                        - Repeat colonoscopy in 10 years  for screening                         purposes. Procedure Code(s):     --- Professional ---                        D6644, Colorectal cancer screening; colonoscopy on                         individual not meeting criteria for high risk Diagnosis Code(s):     --- Professional ---                        Z12.11, Encounter for screening for malignant neoplasm                         of colon CPT copyright 2019 American Medical Association. All rights reserved. The codes documented in this report are preliminary and upon coder review may  be revised to meet current compliance requirements. Dr. Libby Maw Toney Reil MD, MD 10/16/2021 9:00:46 AM This report has been signed electronically. Number of Addenda: 0 Note Initiated On: 10/16/2021 8:22 AM Scope Withdrawal Time: 0 hours 9 minutes 29 seconds  Total Procedure Duration: 0 hours 15 minutes 20 seconds  Estimated Blood Loss:  Estimated blood loss: none.      [facility]

## 2021-10-16 NOTE — Anesthesia Preprocedure Evaluation (Signed)
Anesthesia Evaluation  Patient identified by MRN, date of birth, ID band Patient awake    Reviewed: Allergy & Precautions, NPO status , Patient's Chart, lab work & pertinent test results  History of Anesthesia Complications Negative for: history of anesthetic complications  Airway Mallampati: III  TM Distance: >3 FB Neck ROM: full    Dental  (+) Chipped   Pulmonary neg pulmonary ROS, neg shortness of breath,    Pulmonary exam normal        Cardiovascular Exercise Tolerance: Good (-) anginanegative cardio ROS Normal cardiovascular exam     Neuro/Psych negative neurological ROS  negative psych ROS   GI/Hepatic negative GI ROS, Neg liver ROS, neg GERD  ,  Endo/Other  negative endocrine ROS  Renal/GU negative Renal ROS  negative genitourinary   Musculoskeletal   Abdominal   Peds  Hematology negative hematology ROS (+)   Anesthesia Other Findings Past Medical History: No date: Back pain No date: Hip pain No date: Hyperlipidemia  Past Surgical History: No date: CESAREAN SECTION     Comment:  x 2 No date: CHOLECYSTECTOMY No date: HAND SURGERY  BMI    Body Mass Index: 22.50 kg/m      Reproductive/Obstetrics negative OB ROS                             Anesthesia Physical Anesthesia Plan  ASA: 2  Anesthesia Plan: General   Post-op Pain Management:    Induction: Intravenous  PONV Risk Score and Plan: Propofol infusion and TIVA  Airway Management Planned: Natural Airway and Nasal Cannula  Additional Equipment:   Intra-op Plan:   Post-operative Plan:   Informed Consent: I have reviewed the patients History and Physical, chart, labs and discussed the procedure including the risks, benefits and alternatives for the proposed anesthesia with the patient or authorized representative who has indicated his/her understanding and acceptance.     Dental Advisory Given  Plan  Discussed with: Anesthesiologist, CRNA and Surgeon  Anesthesia Plan Comments: (Patient consented for risks of anesthesia including but not limited to:  - adverse reactions to medications - risk of airway placement if required - damage to eyes, teeth, lips or other oral mucosa - nerve damage due to positioning  - sore throat or hoarseness - Damage to heart, brain, nerves, lungs, other parts of body or loss of life  Patient voiced understanding.)        Anesthesia Quick Evaluation

## 2021-10-16 NOTE — H&P (Addendum)
°  Arlyss Repress, MD [address]  [address]  [place], [place] [address]  Main: [phone]  Fax: [phone] Pager: [phone]  Primary Care Physician:  Fayrene Helper, NP Primary Gastroenterologist:  Dr. Arlyss Repress  Pre-Procedure History & Physical: HPI:  Taylor Fisher is a 61 y.o. female is here for an colonoscopy.   Past Medical History:  Diagnosis Date   Back pain    Hip pain    Hyperlipidemia     Past Surgical History:  Procedure Laterality Date   CESAREAN SECTION     x 2   CHOLECYSTECTOMY     HAND SURGERY      Prior to Admission medications   Medication Sig Start Date End Date Taking? Authorizing Provider  aspirin 81 MG tablet Take 81 mg by mouth daily.   Yes [provider]  Calcium Carbonate-Vitamin D 600-200 MG-UNIT TABS Take 1 tablet by mouth daily.   Yes [provider]  Multiple Vitamins-Minerals (CENTRUM SILVER PO) Take by mouth daily.   Yes [provider]  Omega-3 Fatty Acids (FISH OIL) 1200 MG CAPS Take by mouth daily.   Yes [provider]    Allergies as of 09/26/2021   (No Known Allergies)    Family History  Problem Relation Age of Onset   Kidney Stones Father    Colon cancer Maternal Grandfather    Breast cancer Neg Hx     Social History   Socioeconomic History   Marital status: Married    Spouse name: Not on file   Number of children: Not on file   Years of education: Not on file   Highest education level: Not on file  Occupational History   Not on file  Tobacco Use   Smoking status: Never   Smokeless tobacco: Never  Vaping Use   Vaping Use: Never used  Substance and Sexual Activity   Alcohol use: Yes    Comment: occassionally   Drug use: Never   Sexual activity: Not on file  Other Topics Concern   Not on file  Social History Narrative   Not on file   Social Determinants of Health   Financial Resource Strain: Not on file  Food Insecurity: Not on file  Transportation  Needs: Not on file  Physical Activity: Not on file  Stress: Not on file  Social Connections: Not on file  Intimate Partner Violence: Not on file    Review of Systems: See HPI, otherwise negative ROS  Physical Exam: BP 122/81    Pulse 90    Temp 97.8 F (36.6 C) (Temporal)    Resp 16    Ht 5' 5.5" (1.664 m)    Wt 62.3 kg    LMP  (LMP Unknown)    SpO2 99%    BMI 22.50 kg/m  General:   Alert,  pleasant and cooperative in NAD Head:  Normocephalic and atraumatic. Neck:  Supple; no masses or thyromegaly. Lungs:  Clear throughout to auscultation.    Heart:  Regular rate and rhythm. Abdomen:  Soft, nontender and nondistended. Normal bowel sounds, without guarding, and without rebound.   Neurologic:  Alert and  oriented x4;  grossly normal neurologically.  Impression/Plan: Casey Maxfield Donlon is here for an colonoscopy to be performed for colon cancer screening  Risks, benefits, limitations, and alternatives regarding  colonoscopy have been reviewed with the patient.  Questions have been answered.  All parties agreeable.   Lannette Donath, MD  10/16/2021, 8:30 AM

## 2021-10-16 NOTE — Transfer of Care (Signed)
Immediate Anesthesia Transfer of Care Note  Patient: Taylor Fisher  Procedure(s) Performed: COLONOSCOPY WITH PROPOFOL  Patient Location: PACU and Endoscopy Unit  Anesthesia Type:General  Level of Consciousness: awake  Airway & Oxygen Therapy: Patient Spontanous Breathing  Post-op Assessment: Report given to RN and Post -op Vital signs reviewed and stable  Post vital signs: Reviewed, stable  Last Vitals:  Vitals Value Taken Time  BP 93/60 10/16/21 0858  Temp 36.3 C 10/16/21 0858  Pulse 81 10/16/21 0858  Resp 17 10/16/21 0858  SpO2 97 % 10/16/21 0858    Last Pain:  Vitals:   10/16/21 0858  TempSrc: Temporal  PainSc: 0-No pain         Complications: No notable events documented.

## 2021-10-16 NOTE — Anesthesia Postprocedure Evaluation (Signed)
Anesthesia Post Note  Patient: Taylor Fisher  Procedure(s) Performed: COLONOSCOPY WITH PROPOFOL  Patient location during evaluation: Endoscopy Anesthesia Type: General Level of consciousness: awake and alert Pain management: pain level controlled Vital Signs Assessment: post-procedure vital signs reviewed and stable Respiratory status: spontaneous breathing, nonlabored ventilation, respiratory function stable and patient connected to nasal cannula oxygen Cardiovascular status: blood pressure returned to baseline and stable Postop Assessment: no apparent nausea or vomiting Anesthetic complications: no   No notable events documented.   Last Vitals:  Vitals:   10/16/21 0908 10/16/21 0918  BP: 107/72 (!) 116/59  Pulse: 75 76  Resp: 12 17  Temp:    SpO2: 100% 100%    Last Pain:  Vitals:   10/16/21 0918  TempSrc:   PainSc: 0-No pain                 Cleda Mccreedy Javed Cotto

## 2021-10-17 ENCOUNTER — Encounter: Payer: Self-pay | Admitting: Gastroenterology

## 2022-04-25 ENCOUNTER — Other Ambulatory Visit: Payer: Self-pay | Admitting: Nurse Practitioner

## 2022-04-25 DIAGNOSIS — Z1231 Encounter for screening mammogram for malignant neoplasm of breast: Secondary | ICD-10-CM

## 2022-05-06 ENCOUNTER — Ambulatory Visit
Admission: RE | Admit: 2022-05-06 | Discharge: 2022-05-06 | Disposition: A | Discharge status: Home / Self Care | Payer: 59 | Source: Ambulatory Visit | Attending: Nurse Practitioner | Admitting: Nurse Practitioner

## 2022-05-06 DIAGNOSIS — Z1231 Encounter for screening mammogram for malignant neoplasm of breast: Secondary | ICD-10-CM | POA: Diagnosis present

## 2022-06-06 IMAGING — MG DIGITAL SCREENING BILAT W/ TOMO W/ CAD
6 of 10 series · 6 of 30 positions shown · non-contrast
Comparison: Previous exam(s).

CLINICAL DATA: Screening.

EXAM:
DIGITAL SCREENING BILATERAL MAMMOGRAM WITH TOMO AND CAD

[L CC synth-2D]
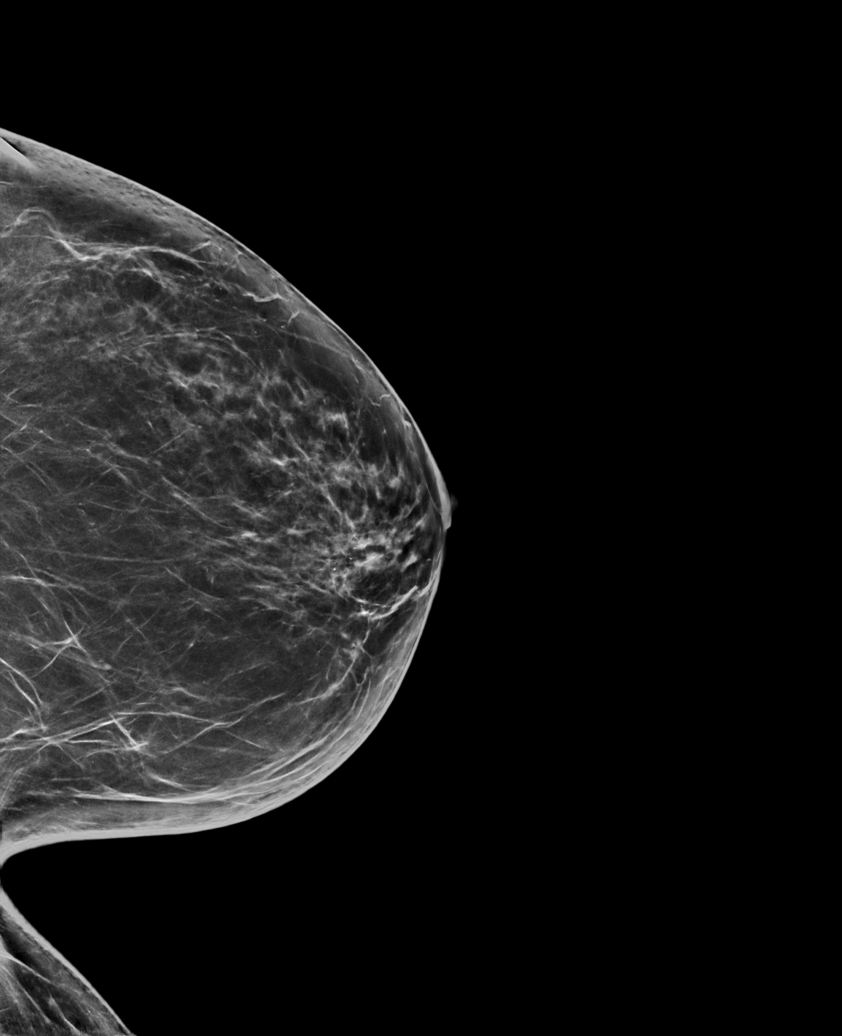

[R CC synth-2D (1 of 2)]
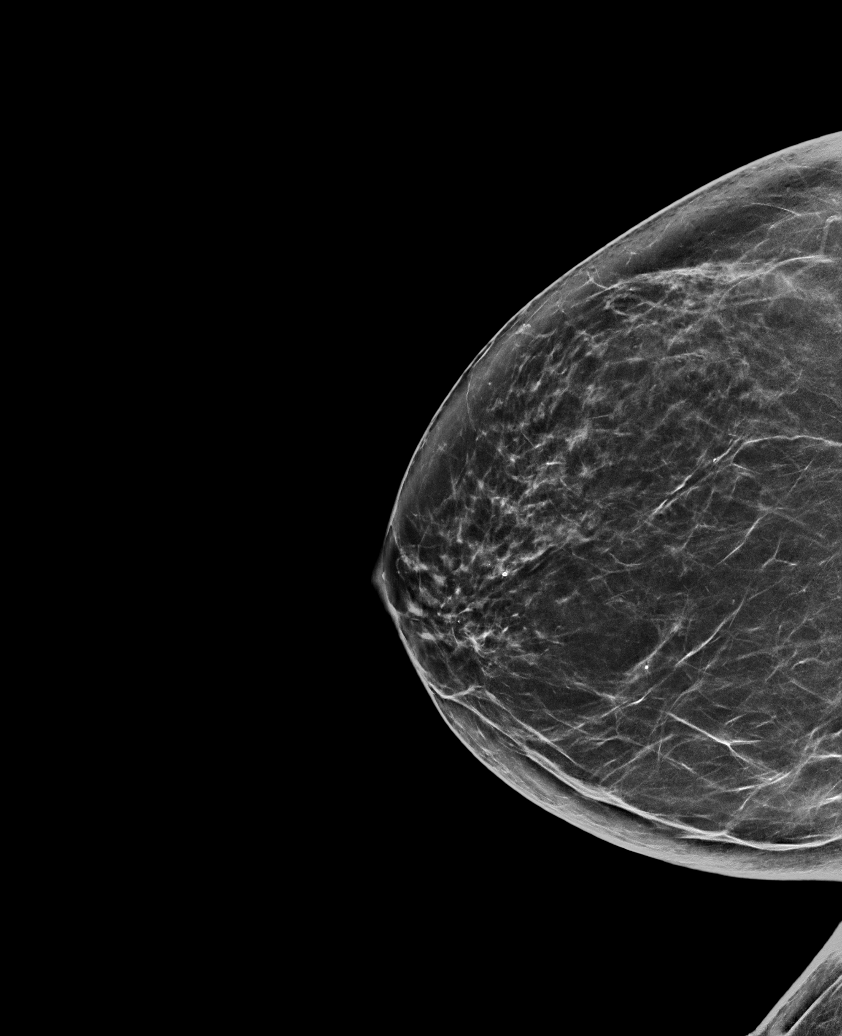

[R MLO synth-2D]
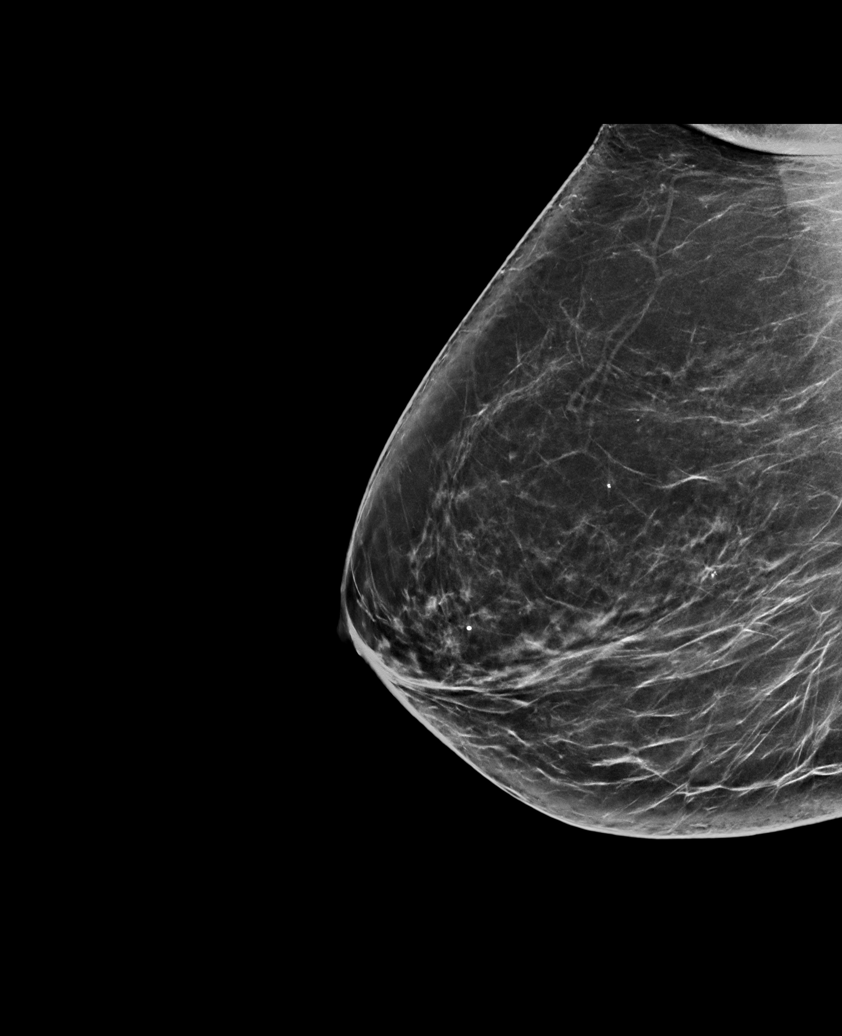

[R CC synth-2D (2 of 2)]
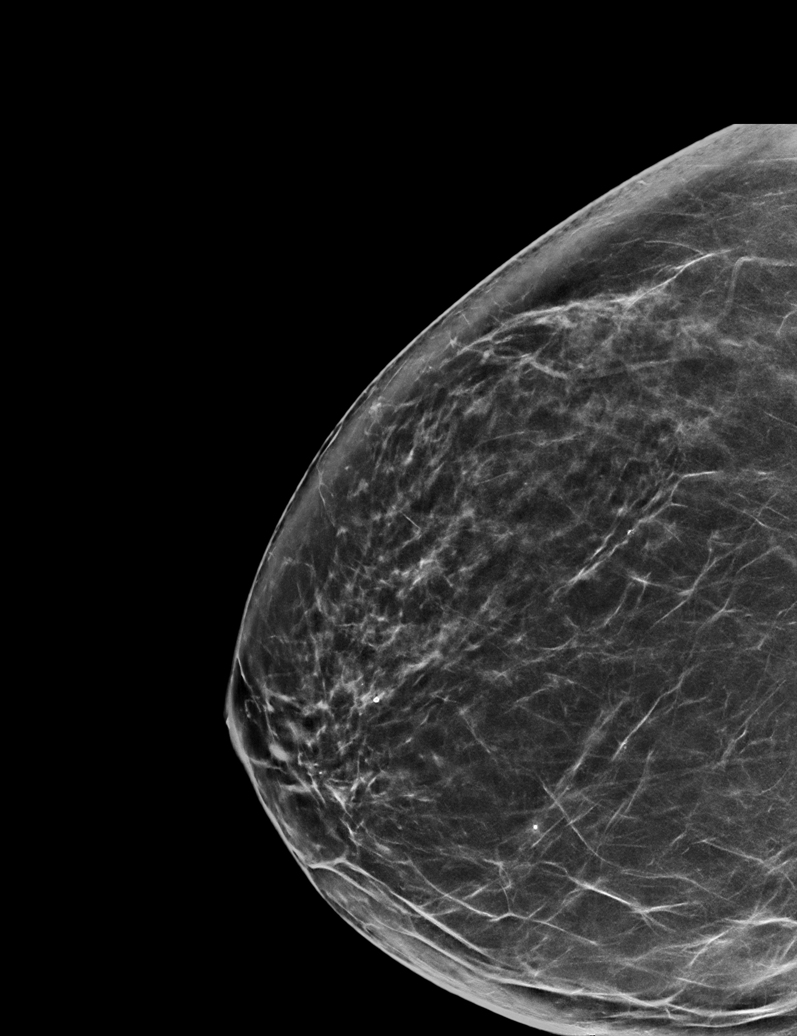

[L MLO synth-2D]
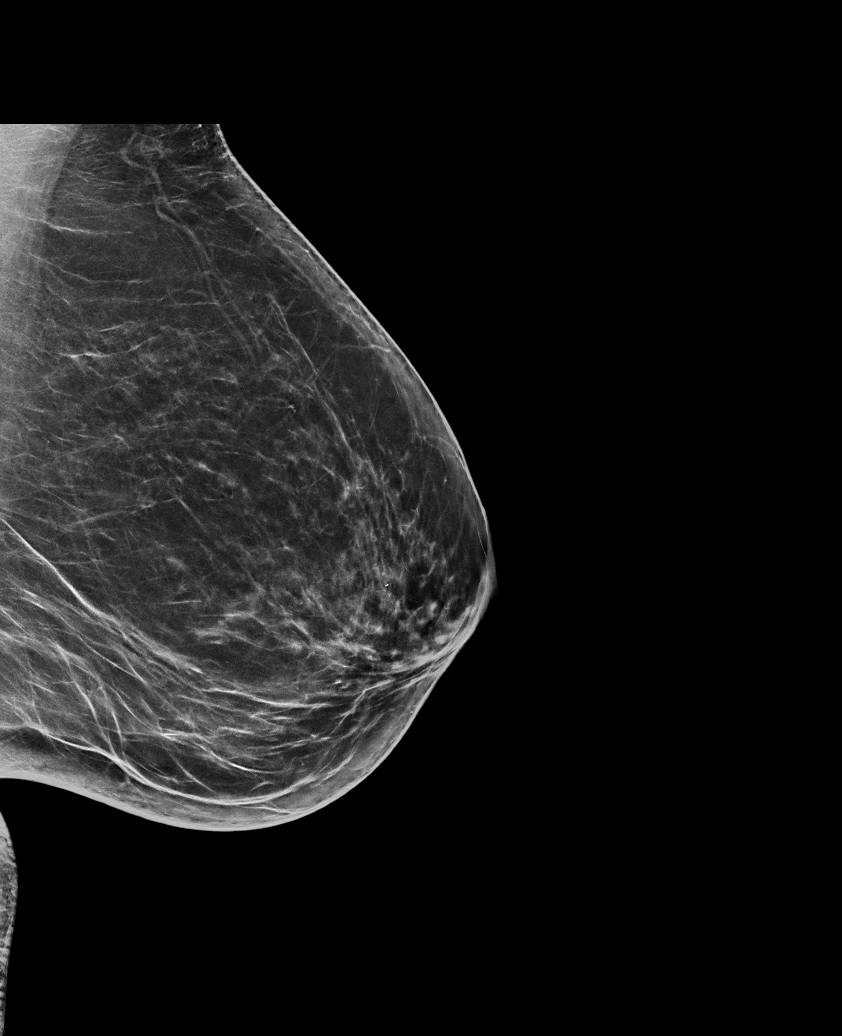

[R CC tomo · tomo slice 37/73.0]
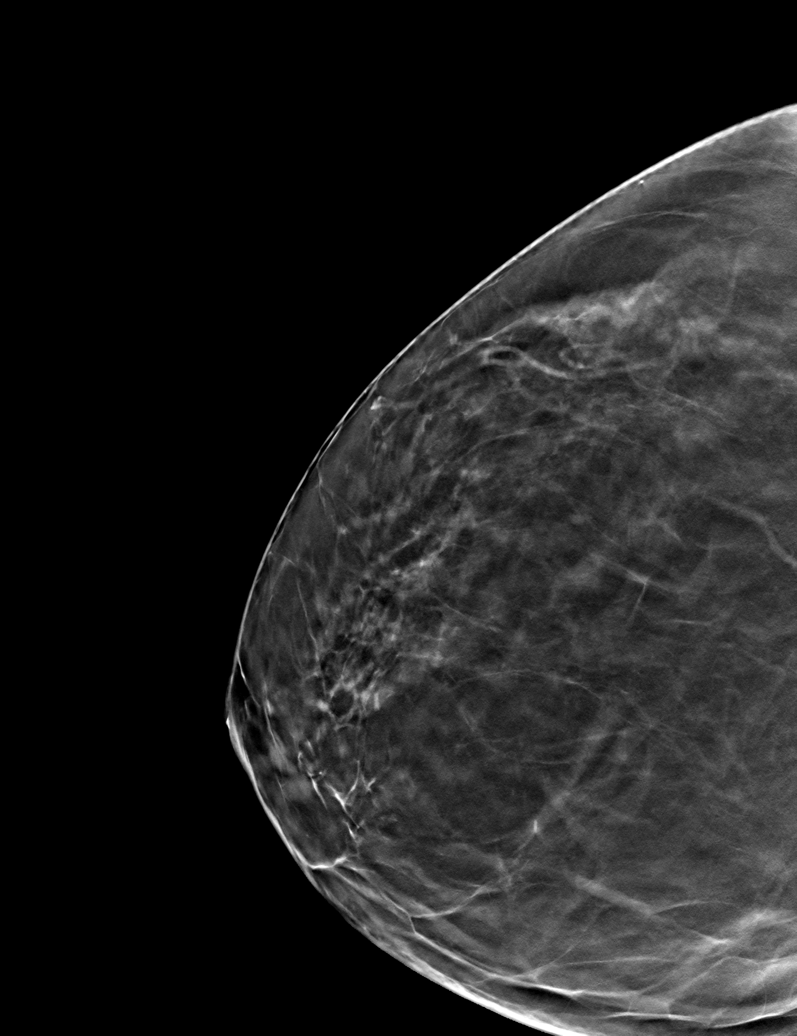

[6 of 30 positions shown; findings below may reference images not displayed]

ACR Breast Density Category b: There are scattered areas of
fibroglandular density.
FINDINGS: In the right breast, possible distortion warrants further
evaluation. This possible distortion is seen within the lower RIGHT
breast, middle to posterior depth, MLO view only, slice 46.

In the left breast, no findings suspicious for malignancy. Images
were processed with CAD.
IMPRESSION: Further evaluation is suggested for possible distortion in the right
breast.

RECOMMENDATION:
Diagnostic mammogram and possibly ultrasound of the right breast.
(Code:[id number]-9-440)

The patient will be contacted regarding the findings, and additional
imaging will be scheduled.

BI-RADS CATEGORY  0: Incomplete. Need additional imaging evaluation
and/or prior mammograms for comparison.

## 2022-10-10 ENCOUNTER — Ambulatory Visit (INDEPENDENT_AMBULATORY_CARE_PROVIDER_SITE_OTHER): Payer: 59 | Admitting: Nurse Practitioner

## 2022-10-10 DIAGNOSIS — R109 Unspecified abdominal pain: Secondary | ICD-10-CM

## 2022-10-10 LAB — POCT URINALYSIS DIPSTICK
Bilirubin, UA: NEGATIVE
Blood, UA: NEGATIVE
Glucose, UA: NEGATIVE
Ketones, UA: NEGATIVE
Leukocytes, UA: NEGATIVE
Nitrite, UA: NEGATIVE
Protein, UA: NEGATIVE
Spec Grav, UA: 1.01 (ref 1.010–1.025)
Urobilinogen, UA: 0.2 E.U./dL
pH, UA: 6 (ref 5.0–8.0)

## 2023-03-23 ENCOUNTER — Ambulatory Visit: Payer: 59 | Admitting: Nurse Practitioner

## 2023-04-02 ENCOUNTER — Other Ambulatory Visit: Payer: Self-pay | Admitting: Nurse Practitioner

## 2023-04-02 DIAGNOSIS — Z1231 Encounter for screening mammogram for malignant neoplasm of breast: Secondary | ICD-10-CM

## 2023-04-22 ENCOUNTER — Other Ambulatory Visit: Payer: 59

## 2023-04-22 ENCOUNTER — Other Ambulatory Visit: Payer: Self-pay | Admitting: Nurse Practitioner

## 2023-04-22 DIAGNOSIS — Z1329 Encounter for screening for other suspected endocrine disorder: Secondary | ICD-10-CM

## 2023-04-22 DIAGNOSIS — I10 Essential (primary) hypertension: Secondary | ICD-10-CM

## 2023-04-22 DIAGNOSIS — Z13220 Encounter for screening for lipoid disorders: Secondary | ICD-10-CM

## 2023-04-22 DIAGNOSIS — Z131 Encounter for screening for diabetes mellitus: Secondary | ICD-10-CM

## 2023-04-23 LAB — LIPID PANEL
Chol/HDL Ratio: 2.9 ratio (ref 0.0–4.4)
Cholesterol, Total: 194 mg/dL (ref 100–199)
HDL: 66 mg/dL (ref >39)
LDL Chol Calc (NIH): 114 mg/dL — ABNORMAL HIGH (ref 0–99)
Triglycerides: 78 mg/dL (ref 0–149)
VLDL Cholesterol Cal: 14 mg/dL (ref 5–40)

## 2023-04-23 LAB — CMP14+EGFR
ALT: 18 IU/L (ref 0–32)
AST: 24 IU/L (ref 0–40)
Albumin: 4.3 g/dL (ref 3.9–4.9)
Alkaline Phosphatase: 76 IU/L (ref 44–121)
BUN/Creatinine Ratio: 13 (ref 12–28)
BUN: 12 mg/dL (ref 8–27)
Bilirubin Total: 0.3 mg/dL (ref 0.0–1.2)
CO2: 25 mmol/L (ref 20–29)
Calcium: 9.4 mg/dL (ref 8.7–10.3)
Chloride: 104 mmol/L (ref 96–106)
Creatinine, Ser: 0.9 mg/dL (ref 0.57–1.00)
Globulin, Total: 2 g/dL (ref 1.5–4.5)
Glucose: 90 mg/dL (ref 70–99)
Potassium: 5 mmol/L (ref 3.5–5.2)
Sodium: 140 mmol/L (ref 134–144)
Total Protein: 6.3 g/dL (ref 6.0–8.5)
eGFR: 73 mL/min/{1.73_m2} (ref >59)

## 2023-04-23 LAB — TSH: TSH: 2.54 u[IU]/mL (ref 0.450–4.500)

## 2023-04-23 LAB — HEMOGLOBIN A1C
Est. average glucose Bld gHb Est-mCnc: 114 mg/dL
Hgb A1c MFr Bld: 5.6 % (ref 4.8–5.6)

## 2023-04-28 ENCOUNTER — Ambulatory Visit (INDEPENDENT_AMBULATORY_CARE_PROVIDER_SITE_OTHER): Payer: 59 | Admitting: Cardiology

## 2023-04-28 ENCOUNTER — Encounter: Payer: Self-pay | Admitting: Cardiology

## 2023-04-28 VITALS — BP 104/76 | HR 76 | Ht 65.0 in | Wt 138.0 lb

## 2023-04-28 DIAGNOSIS — Z0000 Encounter for general adult medical examination without abnormal findings: Secondary | ICD-10-CM | POA: Diagnosis not present

## 2023-04-28 DIAGNOSIS — I10 Essential (primary) hypertension: Secondary | ICD-10-CM

## 2023-04-28 DIAGNOSIS — Z13220 Encounter for screening for lipoid disorders: Secondary | ICD-10-CM | POA: Diagnosis not present

## 2023-04-28 DIAGNOSIS — Z1329 Encounter for screening for other suspected endocrine disorder: Secondary | ICD-10-CM

## 2023-04-28 DIAGNOSIS — Z131 Encounter for screening for diabetes mellitus: Secondary | ICD-10-CM

## 2023-04-28 MED ORDER — SIMVASTATIN 10 MG PO TABS: 10.0000 mg | ORAL_TABLET | Freq: Every day | ORAL | 1 refills | Status: DC

## 2023-04-28 NOTE — Progress Notes (Signed)
Complete physical exam  Patient: Taylor Fisher   DOB: 06-Jun-1961   62 y.o. Female  MRN: [id number]  Subjective:    Chief Complaint  Patient presents with   Annual Exam    CPE    Taylor Fisher is a 62 y.o. female who presents today for a complete physical exam. She reports consuming a general diet. The patient does not participate in regular exercise at present. She generally feels well. She reports sleeping well. She does not have additional problems to discuss today.    Most recent fall risk assessment:     No data to display           Most recent depression screenings:     No data to display          Vision:Not within last year  and Dental: No current dental problems  Past Medical History:  Diagnosis Date   Back pain    Hip pain    Hyperlipidemia     Past Surgical History:  Procedure Laterality Date   CESAREAN SECTION     x 2   CHOLECYSTECTOMY     COLONOSCOPY WITH PROPOFOL N/A 10/16/2021   Procedure: COLONOSCOPY WITH PROPOFOL;  Surgeon: Toney Reil, MD;  Location: [facility] ENDOSCOPY;  Service: Gastroenterology;  Laterality: N/A;   HAND SURGERY      Family History  Problem Relation Age of Onset   Kidney Stones Father    Colon cancer Maternal Grandfather    Breast cancer Neg Hx     Social History   Socioeconomic History   Marital status: Married    Spouse name: Not on file   Number of children: Not on file   Years of education: Not on file   Highest education level: Not on file  Occupational History   Not on file  Tobacco Use   Smoking status: Never   Smokeless tobacco: Never  Vaping Use   Vaping status: Never Used  Substance and Sexual Activity   Alcohol use: Yes    Comment: occassionally   Drug use: Never   Sexual activity: Not on file  Other Topics Concern   Not on file  Social History Narrative   Not on file   Social Determinants of Health   Financial Resource Strain: Not on file  Food Insecurity: Not on file   Transportation Needs: Not on file  Physical Activity: Not on file  Stress: Not on file  Social Connections: Not on file  Intimate Partner Violence: Not on file    Outpatient Medications Prior to Visit  Medication Sig   aspirin 81 MG tablet Take 81 mg by mouth daily.   Calcium Carbonate-Vitamin D 600-200 MG-UNIT TABS Take 1 tablet by mouth daily.   Multiple Vitamins-Minerals (CENTRUM SILVER PO) Take by mouth daily.   Omega-3 Fatty Acids (FISH OIL) 1200 MG CAPS Take by mouth daily.   Turmeric 400 MG CAPS Take by mouth.   [DISCONTINUED] simvastatin (ZOCOR) 10 MG tablet Take 10 mg by mouth daily.   No facility-administered medications prior to visit.    Review of Systems  Constitutional: Negative.   HENT: Negative.    Eyes: Negative.   Respiratory: Negative.  Negative for shortness of breath.   Cardiovascular: Negative.  Negative for chest pain.  Gastrointestinal: Negative.  Negative for abdominal pain, constipation and diarrhea.  Genitourinary: Negative.   Musculoskeletal:  Negative for joint pain and myalgias.  Skin: Negative.   Neurological: Negative.  Negative for dizziness and  headaches.  Endo/Heme/Allergies: Negative.   All other systems reviewed and are negative.       Objective:     BP 104/76   Pulse 76   Ht 5\' 5"  (1.651 m)   Wt 138 lb (62.6 kg)   LMP  (LMP Unknown)   SpO2 96%   BMI 22.96 kg/m  BP Readings from Last 3 Encounters:  04/28/23 104/76  10/16/21 (!) 116/59  02/11/21 (!) 120/57      Physical Exam Vitals and nursing note reviewed.  Constitutional:      Appearance: Normal appearance. She is normal weight.  HENT:     Head: Normocephalic and atraumatic.     Nose: Nose normal.     Mouth/Throat:     Mouth: Mucous membranes are moist.  Eyes:     Extraocular Movements: Extraocular movements intact.     Conjunctiva/sclera: Conjunctivae normal.     Pupils: Pupils are equal, round, and reactive to light.  Cardiovascular:     Rate and Rhythm:  Normal rate and regular rhythm.     Pulses: Normal pulses.     Heart sounds: Normal heart sounds.  Pulmonary:     Effort: Pulmonary effort is normal.     Breath sounds: Normal breath sounds.  Abdominal:     General: Abdomen is flat. Bowel sounds are normal.     Palpations: Abdomen is soft.  Musculoskeletal:        General: Normal range of motion.     Cervical back: Normal range of motion.  Skin:    General: Skin is warm and dry.  Neurological:     General: No focal deficit present.     Mental Status: She is alert and oriented to person, place, and time.  Psychiatric:        Mood and Affect: Mood normal.        Behavior: Behavior normal.        Thought Content: Thought content normal.        Judgment: Judgment normal.      No results found for any visits on 04/28/23.  Recent Results (from the past 2160 hour(s))  Hemoglobin A1c     Status: None   Collection Time: 04/22/23 11:35 AM  Result Value Ref Range   Hgb A1c MFr Bld 5.6 4.8 - 5.6 %    Comment:          Prediabetes: 5.7 - 6.4          Diabetes: >6.4          Glycemic control for adults with diabetes: <7.0    Est. average glucose Bld gHb Est-mCnc 114 mg/dL  TSH     Status: None   Collection Time: 04/22/23 11:35 AM  Result Value Ref Range   TSH 2.540 0.450 - 4.500 uIU/mL  CMP14+EGFR     Status: None   Collection Time: 04/22/23 11:35 AM  Result Value Ref Range   Glucose 90 70 - 99 mg/dL   BUN 12 8 - 27 mg/dL   Creatinine, Ser 1.61 0.57 - 1.00 mg/dL   eGFR 73 >09 UE/AVW/0.98   BUN/Creatinine Ratio 13 12 - 28   Sodium 140 134 - 144 mmol/L   Potassium 5.0 3.5 - 5.2 mmol/L   Chloride 104 96 - 106 mmol/L   CO2 25 20 - 29 mmol/L   Calcium 9.4 8.7 - 10.3 mg/dL   Total Protein 6.3 6.0 - 8.5 g/dL   Albumin 4.3 3.9 - 4.9 g/dL  Globulin, Total 2.0 1.5 - 4.5 g/dL   Bilirubin Total 0.3 0.0 - 1.2 mg/dL   Alkaline Phosphatase 76 44 - 121 IU/L   AST 24 0 - 40 IU/L   ALT 18 0 - 32 IU/L  Lipid panel     Status: Abnormal    Collection Time: 04/22/23 11:35 AM  Result Value Ref Range   Cholesterol, Total 194 100 - 199 mg/dL   Triglycerides 78 0 - 149 mg/dL   HDL 66 >40 mg/dL   VLDL Cholesterol Cal 14 5 - 40 mg/dL   LDL Chol Calc (NIH) 981 (H) 0 - 99 mg/dL   Chol/HDL Ratio 2.9 0.0 - 4.4 ratio    Comment:                                   T. Chol/HDL Ratio                                             Men  Women                               1/2 Avg.Risk  3.4    3.3                                   Avg.Risk  5.0    4.4                                2X Avg.Risk  9.6    7.1                                3X Avg.Risk 23.4   11.0         Assessment & Plan:    Routine Health Maintenance and Physical Exam  Up to date on her health maintenance exams   Immunization History  Administered Date(s) Administered   Influenza, Quadrivalent, Recombinant, Inj, Pf 06/28/2021   Influenza-Unspecified 09/30/2011   Tdap 09/30/2011   Zoster Recombinant(Shingrix) 06/25/2020    Health Maintenance  Topic Date Due   HIV Screening  Never done   Hepatitis C Screening  Never done   Zoster Vaccines- Shingrix (2 of 2) 08/20/2020   DTaP/Tdap/Td (2 - Td or Tdap) 09/29/2021   COVID-19 Vaccine (1 - 2023-24 season) Never done   INFLUENZA VACCINE  04/09/2023   PAP SMEAR-Modifier  04/16/2024   MAMMOGRAM  05/06/2024   Colonoscopy  10/17/2031   HPV VACCINES  Aged Out    Discussed health benefits of physical activity, and encouraged her to engage in regular exercise appropriate for her age and condition.  Problem List Items Addressed This Visit       Other   Encounter for health maintenance examination - Primary   Other Visit Diagnoses     Essential hypertension       Relevant Medications   simvastatin (ZOCOR) 10 MG tablet   Screening for hyperlipidemia       Screening for thyroid disorder       Screening for diabetes mellitus  Return in about 1 year (around 04/27/2024) for with fasting labs.     Marisue Ivan, NP  04/28/2023   This document may have been prepared by [facility] Voice Recognition software and as such may include unintentional dictation errors.

## 2023-05-18 ENCOUNTER — Ambulatory Visit
Admission: RE | Admit: 2023-05-18 | Discharge: 2023-05-18 | Disposition: A | Discharge status: Home / Self Care | Payer: 59 | Source: Ambulatory Visit | Attending: Nurse Practitioner | Admitting: Nurse Practitioner

## 2023-05-18 DIAGNOSIS — Z1231 Encounter for screening mammogram for malignant neoplasm of breast: Secondary | ICD-10-CM | POA: Insufficient documentation

## 2024-03-14 ENCOUNTER — Other Ambulatory Visit: Payer: Self-pay | Admitting: Cardiology

## 2024-03-14 ENCOUNTER — Telehealth: Payer: Self-pay

## 2024-03-14 MED ORDER — AMOXICILLIN-POT CLAVULANATE 875-125 MG PO TABS: 1.0000 | ORAL_TABLET | Freq: Two times a day (BID) | ORAL | 0 refills | Status: AC

## 2024-03-14 NOTE — Telephone Encounter (Signed)
 Patient called asking for an abx to be called in for her stating that a week ago she had a fever of 102+ and head congestion, she's been taking mucinex otc  please advise

## 2024-04-20 ENCOUNTER — Other Ambulatory Visit

## 2024-04-20 DIAGNOSIS — Z13220 Encounter for screening for lipoid disorders: Secondary | ICD-10-CM

## 2024-04-20 DIAGNOSIS — I10 Essential (primary) hypertension: Secondary | ICD-10-CM

## 2024-04-20 DIAGNOSIS — Z131 Encounter for screening for diabetes mellitus: Secondary | ICD-10-CM

## 2024-04-20 DIAGNOSIS — Z1329 Encounter for screening for other suspected endocrine disorder: Secondary | ICD-10-CM

## 2024-04-21 ENCOUNTER — Ambulatory Visit: Payer: Self-pay | Admitting: Cardiology

## 2024-04-21 LAB — CMP14+EGFR
ALT: 19 IU/L (ref 0–32)
AST: 24 IU/L (ref 0–40)
Albumin: 4.3 g/dL (ref 3.9–4.9)
Alkaline Phosphatase: 79 IU/L (ref 44–121)
BUN/Creatinine Ratio: 15 (ref 12–28)
BUN: 13 mg/dL (ref 8–27)
Bilirubin Total: 0.4 mg/dL (ref 0.0–1.2)
CO2: 22 mmol/L (ref 20–29)
Calcium: 9 mg/dL (ref 8.7–10.3)
Chloride: 104 mmol/L (ref 96–106)
Creatinine, Ser: 0.85 mg/dL (ref 0.57–1.00)
Globulin, Total: 2.2 g/dL (ref 1.5–4.5)
Glucose: 83 mg/dL (ref 70–99)
Potassium: 4.4 mmol/L (ref 3.5–5.2)
Sodium: 140 mmol/L (ref 134–144)
Total Protein: 6.5 g/dL (ref 6.0–8.5)
eGFR: 77 mL/min/1.73 (ref >59)

## 2024-04-21 LAB — LIPID PANEL
Chol/HDL Ratio: 3.7 ratio (ref 0.0–4.4)
Cholesterol, Total: 236 mg/dL — ABNORMAL HIGH (ref 100–199)
HDL: 64 mg/dL (ref >39)
LDL Chol Calc (NIH): 163 mg/dL — ABNORMAL HIGH (ref 0–99)
Triglycerides: 57 mg/dL (ref 0–149)
VLDL Cholesterol Cal: 9 mg/dL (ref 5–40)

## 2024-04-21 LAB — HEMOGLOBIN A1C
Est. average glucose Bld gHb Est-mCnc: 114 mg/dL
Hgb A1c MFr Bld: 5.6 % (ref 4.8–5.6)

## 2024-04-21 LAB — TSH: TSH: 2.23 u[IU]/mL (ref 0.450–4.500)

## 2024-04-28 ENCOUNTER — Ambulatory Visit (INDEPENDENT_AMBULATORY_CARE_PROVIDER_SITE_OTHER): Payer: 59 | Admitting: Cardiology

## 2024-04-28 ENCOUNTER — Encounter: Payer: Self-pay | Admitting: Cardiology

## 2024-04-28 VITALS — BP 122/70 | HR 75 | Ht 65.5 in | Wt 139.0 lb

## 2024-04-28 DIAGNOSIS — Z0001 Encounter for general adult medical examination with abnormal findings: Secondary | ICD-10-CM

## 2024-04-28 DIAGNOSIS — Z0000 Encounter for general adult medical examination without abnormal findings: Secondary | ICD-10-CM

## 2024-04-28 DIAGNOSIS — I10 Essential (primary) hypertension: Secondary | ICD-10-CM | POA: Insufficient documentation

## 2024-04-28 DIAGNOSIS — Z0130 Encounter for examination of blood pressure without abnormal findings: Secondary | ICD-10-CM

## 2024-04-28 DIAGNOSIS — E782 Mixed hyperlipidemia: Secondary | ICD-10-CM | POA: Insufficient documentation

## 2024-04-28 NOTE — Progress Notes (Signed)
 Complete physical exam  Patient: Taylor Fisher   DOB: 1961/01/24   63 y.o. Female  MRN: [id number]  Subjective:    Chief Complaint  Patient presents with   Annual Exam    CPE    Taylor Fisher is a 63 y.o. female who presents today for a complete physical exam. She reports consuming a general diet. The patient does not participate in regular exercise at present. She generally feels well. She reports sleeping well. She does not have additional problems to discuss today.    Most recent fall risk assessment:     No data to display           Most recent depression screenings:     No data to display          Vision:Within last year and Dental: No current dental problems and Receives regular dental care  Past Medical History:  Diagnosis Date   Back pain    Hip pain    Hyperlipidemia     Past Surgical History:  Procedure Laterality Date   CESAREAN SECTION     x 2   CHOLECYSTECTOMY     COLONOSCOPY WITH PROPOFOL  N/A 10/16/2021   Procedure: COLONOSCOPY WITH PROPOFOL ;  Surgeon: Unk Corinn Skiff, MD;  Location: [facility] ENDOSCOPY;  Service: Gastroenterology;  Laterality: N/A;   HAND SURGERY      Family History  Problem Relation Age of Onset   Kidney Stones Father    Colon cancer Maternal Grandfather    Breast cancer Neg Hx     Social History   Socioeconomic History   Marital status: Married    Spouse name: Not on file   Number of children: Not on file   Years of education: Not on file   Highest education level: Not on file  Occupational History   Not on file  Tobacco Use   Smoking status: Never   Smokeless tobacco: Never  Vaping Use   Vaping status: Never Used  Substance and Sexual Activity   Alcohol use: Yes    Comment: occassionally   Drug use: Never   Sexual activity: Not on file  Other Topics Concern   Not on file  Social History Narrative   Not on file   Social Drivers of Health   Financial Resource Strain: Not on file  Food  Insecurity: Not on file  Transportation Needs: Not on file  Physical Activity: Not on file  Stress: Not on file  Social Connections: Not on file  Intimate Partner Violence: Not on file    Outpatient Medications Prior to Visit  Medication Sig   aspirin 81 MG tablet Take 81 mg by mouth daily.   Calcium Carbonate-Vitamin D 600-200 MG-UNIT TABS Take 1 tablet by mouth daily.   Multiple Vitamins-Minerals (CENTRUM SILVER PO) Take by mouth daily.   Omega-3 Fatty Acids (FISH OIL) 1200 MG CAPS Take by mouth daily.   simvastatin  (ZOCOR ) 10 MG tablet Take 1 tablet (10 mg total) by mouth daily.   Turmeric 400 MG CAPS Take by mouth.   No facility-administered medications prior to visit.    Review of Systems  Constitutional: Negative.   HENT: Negative.    Eyes: Negative.   Respiratory: Negative.  Negative for shortness of breath.   Cardiovascular: Negative.  Negative for chest pain.  Gastrointestinal: Negative.  Negative for abdominal pain, constipation and diarrhea.  Genitourinary: Negative.   Musculoskeletal:  Negative for joint pain and myalgias.  Skin: Negative.   Neurological: Negative.  Negative for dizziness and headaches.  Endo/Heme/Allergies: Negative.   All other systems reviewed and are negative.       Objective:     BP 122/70   Pulse 75   Ht 5' 5.5 (1.664 m)   Wt 139 lb (63 kg)   LMP  (LMP Unknown)   SpO2 99%   BMI 22.78 kg/m  BP Readings from Last 3 Encounters:  04/28/24 122/70  04/28/23 104/76  10/16/21 (!) 116/59      Physical Exam Vitals and nursing note reviewed.  Constitutional:      Appearance: Normal appearance. She is normal weight.  HENT:     Head: Normocephalic and atraumatic.     Nose: Nose normal.     Mouth/Throat:     Mouth: Mucous membranes are moist.  Eyes:     Extraocular Movements: Extraocular movements intact.     Conjunctiva/sclera: Conjunctivae normal.     Pupils: Pupils are equal, round, and reactive to light.  Cardiovascular:      Rate and Rhythm: Normal rate and regular rhythm.     Pulses: Normal pulses.     Heart sounds: Normal heart sounds.  Pulmonary:     Effort: Pulmonary effort is normal.     Breath sounds: Normal breath sounds.  Abdominal:     General: Abdomen is flat. Bowel sounds are normal.     Palpations: Abdomen is soft.  Musculoskeletal:        General: Normal range of motion.     Cervical back: Normal range of motion.  Skin:    General: Skin is warm and dry.  Neurological:     General: No focal deficit present.     Mental Status: She is alert and oriented to person, place, and time.  Psychiatric:        Mood and Affect: Mood normal.        Behavior: Behavior normal.        Thought Content: Thought content normal.        Judgment: Judgment normal.      No results found for any visits on 04/28/24.  Recent Results (from the past 2160 hours)  CMP14+EGFR     Status: None   Collection Time: 04/20/24  8:17 AM  Result Value Ref Range   Glucose 83 70 - 99 mg/dL   BUN 13 8 - 27 mg/dL   Creatinine, Ser 9.14 0.57 - 1.00 mg/dL   eGFR 77 >40 fO/fpw/8.26   BUN/Creatinine Ratio 15 12 - 28   Sodium 140 134 - 144 mmol/L   Potassium 4.4 3.5 - 5.2 mmol/L   Chloride 104 96 - 106 mmol/L   CO2 22 20 - 29 mmol/L   Calcium 9.0 8.7 - 10.3 mg/dL   Total Protein 6.5 6.0 - 8.5 g/dL   Albumin 4.3 3.9 - 4.9 g/dL   Globulin, Total 2.2 1.5 - 4.5 g/dL   Bilirubin Total 0.4 0.0 - 1.2 mg/dL   Alkaline Phosphatase 79 44 - 121 IU/L   AST 24 0 - 40 IU/L   ALT 19 0 - 32 IU/L  Lipid panel     Status: Abnormal   Collection Time: 04/20/24  8:17 AM  Result Value Ref Range   Cholesterol, Total 236 (H) 100 - 199 mg/dL   Triglycerides 57 0 - 149 mg/dL   HDL 64 >60 mg/dL   VLDL Cholesterol Cal 9 5 - 40 mg/dL   LDL Chol Calc (NIH) 836 (H) 0 - 99 mg/dL  Chol/HDL Ratio 3.7 0.0 - 4.4 ratio    Comment:                                   T. Chol/HDL Ratio                                             Men  Women                                1/2 Avg.Risk  3.4    3.3                                   Avg.Risk  5.0    4.4                                2X Avg.Risk  9.6    7.1                                3X Avg.Risk 23.4   11.0   Hemoglobin A1c     Status: None   Collection Time: 04/20/24  8:17 AM  Result Value Ref Range   Hgb A1c MFr Bld 5.6 4.8 - 5.6 %    Comment:          Prediabetes: 5.7 - 6.4          Diabetes: >6.4          Glycemic control for adults with diabetes: <7.0    Est. average glucose Bld gHb Est-mCnc 114 mg/dL  TSH     Status: None   Collection Time: 04/20/24  8:17 AM  Result Value Ref Range   TSH 2.230 0.450 - 4.500 uIU/mL        Assessment & Plan:    Routine Health Maintenance and Physical Exam  Immunization History  Administered Date(s) Administered   Influenza, Quadrivalent, Recombinant, Inj, Pf 06/28/2021   Influenza-Unspecified 09/30/2011   Tdap 09/30/2011   Zoster Recombinant(Shingrix) 06/25/2020    Health Maintenance  Topic Date Due   HIV Screening  Never done   Hepatitis C Screening  Never done   Cervical Cancer Screening (HPV/Pap Cotest)  Never done   Pneumococcal Vaccine: 50+ Years (1 of 1 - PCV) Never done   Zoster Vaccines- Shingrix (2 of 2) 08/20/2020   DTaP/Tdap/Td (2 - Td or Tdap) 09/29/2021   COVID-19 Vaccine (1 - 2024-25 season) Never done   INFLUENZA VACCINE  04/08/2024   MAMMOGRAM  05/17/2025   Colonoscopy  10/17/2031   Hepatitis B Vaccines 19-59 Average Risk  Aged Out   HPV VACCINES  Aged Out   Meningococcal B Vaccine  Aged Out    Discussed health benefits of physical activity, and encouraged her to engage in regular exercise appropriate for her age and condition.  Problem List Items Addressed This Visit       Other   Encounter for annual health examination - Primary   Mixed hyperlipidemia   Return in about 1 year (around 04/28/2025).     Bhargav Barbaro, NP  04/28/2024   This document may have been prepared by Dragon Voice  Recognition software and as such may include unintentional dictation errors.

## 2024-05-04 ENCOUNTER — Telehealth: Payer: Self-pay

## 2024-05-04 NOTE — Telephone Encounter (Signed)
 Pt brought in paperwork to be faxed to cardiology at her last appointment. Pt is wondering what the update is on that.

## 2024-05-13 ENCOUNTER — Telehealth: Payer: Self-pay

## 2024-05-13 NOTE — Telephone Encounter (Signed)
 Patient LM asking for muscle relaxer to be called in stating that she is having some back issues and has seen the chiropractor but still having spasms in her back

## 2024-05-16 ENCOUNTER — Encounter: Payer: Self-pay | Admitting: Cardiology

## 2024-05-16 ENCOUNTER — Ambulatory Visit (INDEPENDENT_AMBULATORY_CARE_PROVIDER_SITE_OTHER): Admitting: Cardiology

## 2024-05-16 ENCOUNTER — Ambulatory Visit: Payer: Self-pay | Admitting: Cardiology

## 2024-05-16 VITALS — BP 158/60 | HR 89 | Ht 65.0 in | Wt 140.0 lb

## 2024-05-16 DIAGNOSIS — R109 Unspecified abdominal pain: Secondary | ICD-10-CM | POA: Diagnosis not present

## 2024-05-16 DIAGNOSIS — N3000 Acute cystitis without hematuria: Secondary | ICD-10-CM | POA: Insufficient documentation

## 2024-05-16 LAB — POCT URINALYSIS DIPSTICK
Bilirubin, UA: NEGATIVE
Blood, UA: NEGATIVE
Glucose, UA: NEGATIVE
Ketones, UA: NEGATIVE
Nitrite, UA: NEGATIVE
Protein, UA: NEGATIVE
Spec Grav, UA: 1.01 (ref 1.010–1.025)
Urobilinogen, UA: 0.2 U/dL
pH, UA: 6 (ref 5.0–8.0)

## 2024-05-16 MED ORDER — CYCLOBENZAPRINE HCL 10 MG PO TABS: 10.0000 mg | ORAL_TABLET | Freq: Three times a day (TID) | ORAL | 0 refills | Status: AC | PRN

## 2024-05-16 MED ORDER — CIPROFLOXACIN HCL 500 MG PO TABS: 500.0000 mg | ORAL_TABLET | Freq: Two times a day (BID) | ORAL | 0 refills | Status: AC

## 2024-05-16 NOTE — Progress Notes (Signed)
 Established Patient Office Visit  Subjective:  Patient ID: Taylor Fisher, female    DOB: 1960/10/04  Age: 63 y.o. MRN: [id number]  Chief Complaint  Patient presents with   Acute Visit    Severe back and side pain     Patient in office for an acute visit, complaining of back and side pain. Patient states she has been having lower back pain for the past couple weeks, now having pain LLQ. Went to the chiropractor, some relief. Has been taking ibuprofen 800 mg with some relief. UA today abnormal. Will send for culture. Will send in Cipro  and a muscle relaxer. Patient to return in 2 weeks if pain continues. Will recheck urine at that time.   Urinary Tract Infection  This is a new problem. The current episode started 1 to 4 weeks ago. The quality of the pain is described as aching. The pain is moderate. There has been no fever. Associated symptoms include flank pain. She has tried NSAIDs for the symptoms. The treatment provided mild relief.    No other concerns at this time.   Past Medical History:  Diagnosis Date   Back pain    Hip pain    Hyperlipidemia     Past Surgical History:  Procedure Laterality Date   CESAREAN SECTION     x 2   CHOLECYSTECTOMY     COLONOSCOPY WITH PROPOFOL  N/A 10/16/2021   Procedure: COLONOSCOPY WITH PROPOFOL ;  Surgeon: Unk Corinn Skiff, MD;  Location: [facility] ENDOSCOPY;  Service: Gastroenterology;  Laterality: N/A;   HAND SURGERY      Social History   Socioeconomic History   Marital status: Married    Spouse name: Not on file   Number of children: Not on file   Years of education: Not on file   Highest education level: Not on file  Occupational History   Not on file  Tobacco Use   Smoking status: Never   Smokeless tobacco: Never  Vaping Use   Vaping status: Never Used  Substance and Sexual Activity   Alcohol use: Yes    Comment: occassionally   Drug use: Never   Sexual activity: Not on file  Other Topics Concern   Not on file  Social  History Narrative   Not on file   Social Drivers of Health   Financial Resource Strain: Not on file  Food Insecurity: Not on file  Transportation Needs: Not on file  Physical Activity: Not on file  Stress: Not on file  Social Connections: Not on file  Intimate Partner Violence: Not on file    Family History  Problem Relation Age of Onset   Kidney Stones Father    Colon cancer Maternal Grandfather    Breast cancer Neg Hx     No Known Allergies  Outpatient Medications Prior to Visit  Medication Sig   aspirin 81 MG tablet Take 81 mg by mouth daily.   Calcium Carbonate-Vitamin D 600-200 MG-UNIT TABS Take 1 tablet by mouth daily.   Multiple Vitamins-Minerals (CENTRUM SILVER PO) Take by mouth daily.   Omega-3 Fatty Acids (FISH OIL) 1200 MG CAPS Take by mouth daily.   simvastatin  (ZOCOR ) 10 MG tablet Take 1 tablet (10 mg total) by mouth daily.   Turmeric 400 MG CAPS Take by mouth.   No facility-administered medications prior to visit.    Review of Systems  Constitutional: Negative.   HENT: Negative.    Eyes: Negative.   Respiratory: Negative.  Negative for shortness of breath.  Cardiovascular: Negative.  Negative for chest pain.  Gastrointestinal: Negative.  Negative for abdominal pain, constipation and diarrhea.  Genitourinary:  Positive for flank pain.  Musculoskeletal:  Negative for joint pain and myalgias.  Skin: Negative.   Neurological: Negative.  Negative for dizziness and headaches.  Endo/Heme/Allergies: Negative.   All other systems reviewed and are negative.      Objective:   BP (!) 158/60   Pulse 89   Ht 5' 5 (1.651 m)   Wt 140 lb (63.5 kg)   LMP  (LMP Unknown)   SpO2 97%   BMI 23.30 kg/m   Vitals:   05/16/24 1011  BP: (!) 158/60  Pulse: 89  Height: 5' 5 (1.651 m)  Weight: 140 lb (63.5 kg)  SpO2: 97%  BMI (Calculated): 23.3    Physical Exam Vitals and nursing note reviewed.  Constitutional:      Appearance: Normal appearance. She is  normal weight.  HENT:     Head: Normocephalic and atraumatic.     Nose: Nose normal.     Mouth/Throat:     Mouth: Mucous membranes are moist.  Eyes:     Extraocular Movements: Extraocular movements intact.     Conjunctiva/sclera: Conjunctivae normal.     Pupils: Pupils are equal, round, and reactive to light.  Cardiovascular:     Rate and Rhythm: Normal rate and regular rhythm.     Pulses: Normal pulses.     Heart sounds: Normal heart sounds.  Pulmonary:     Effort: Pulmonary effort is normal.     Breath sounds: Normal breath sounds.  Abdominal:     General: Abdomen is flat. Bowel sounds are normal.     Palpations: Abdomen is soft.  Musculoskeletal:        General: Normal range of motion.     Cervical back: Normal range of motion.  Skin:    General: Skin is warm and dry.  Neurological:     General: No focal deficit present.     Mental Status: She is alert and oriented to person, place, and time.  Psychiatric:        Mood and Affect: Mood normal.        Behavior: Behavior normal.        Thought Content: Thought content normal.        Judgment: Judgment normal.      Results for orders placed or performed in visit on 05/16/24  POCT Urinalysis Dipstick (81002)  Result Value Ref Range   Color, UA     Clarity, UA     Glucose, UA Negative Negative   Bilirubin, UA Negative    Ketones, UA Negative    Spec Grav, UA 1.010 1.010 - 1.025   Blood, UA Negative    pH, UA 6.0 5.0 - 8.0   Protein, UA Negative Negative   Urobilinogen, UA 0.2 0.2 or 1.0 E.U./dL   Nitrite, UA Negative    Leukocytes, UA Moderate (2+) (A) Negative   Appearance     Odor      Recent Results (from the past 2160 hours)  CMP14+EGFR     Status: None   Collection Time: 04/20/24  8:17 AM  Result Value Ref Range   Glucose 83 70 - 99 mg/dL   BUN 13 8 - 27 mg/dL   Creatinine, Ser 9.14 0.57 - 1.00 mg/dL   eGFR 77 >40 fO/fpw/8.26   BUN/Creatinine Ratio 15 12 - 28   Sodium 140 134 - 144 mmol/L  Potassium 4.4 3.5 - 5.2 mmol/L   Chloride 104 96 - 106 mmol/L   CO2 22 20 - 29 mmol/L   Calcium 9.0 8.7 - 10.3 mg/dL   Total Protein 6.5 6.0 - 8.5 g/dL   Albumin 4.3 3.9 - 4.9 g/dL   Globulin, Total 2.2 1.5 - 4.5 g/dL   Bilirubin Total 0.4 0.0 - 1.2 mg/dL   Alkaline Phosphatase 79 44 - 121 IU/L   AST 24 0 - 40 IU/L   ALT 19 0 - 32 IU/L  Lipid panel     Status: Abnormal   Collection Time: 04/20/24  8:17 AM  Result Value Ref Range   Cholesterol, Total 236 (H) 100 - 199 mg/dL   Triglycerides 57 0 - 149 mg/dL   HDL 64 >60 mg/dL   VLDL Cholesterol Cal 9 5 - 40 mg/dL   LDL Chol Calc (NIH) 836 (H) 0 - 99 mg/dL   Chol/HDL Ratio 3.7 0.0 - 4.4 ratio    Comment:                                   T. Chol/HDL Ratio                                             Men  Women                               1/2 Avg.Risk  3.4    3.3                                   Avg.Risk  5.0    4.4                                2X Avg.Risk  9.6    7.1                                3X Avg.Risk 23.4   11.0   Hemoglobin A1c     Status: None   Collection Time: 04/20/24  8:17 AM  Result Value Ref Range   Hgb A1c MFr Bld 5.6 4.8 - 5.6 %    Comment:          Prediabetes: 5.7 - 6.4          Diabetes: >6.4          Glycemic control for adults with diabetes: <7.0    Est. average glucose Bld gHb Est-mCnc 114 mg/dL  TSH     Status: None   Collection Time: 04/20/24  8:17 AM  Result Value Ref Range   TSH 2.230 0.450 - 4.500 uIU/mL  POCT Urinalysis Dipstick (18997)     Status: Abnormal   Collection Time: 05/16/24 10:21 AM  Result Value Ref Range   Color, UA     Clarity, UA     Glucose, UA Negative Negative   Bilirubin, UA Negative    Ketones, UA Negative    Spec Grav, UA 1.010 1.010 - 1.025   Blood, UA Negative    pH, UA  6.0 5.0 - 8.0   Protein, UA Negative Negative   Urobilinogen, UA 0.2 0.2 or 1.0 E.U./dL   Nitrite, UA Negative    Leukocytes, UA Moderate (2+) (A) Negative   Appearance     Odor         Assessment & Plan:  Urine culture Cipro  Flexeril   Problem List Items Addressed This Visit       Genitourinary   Acute cystitis without hematuria - Primary     Other   Acute right flank pain   Relevant Orders   POCT Urinalysis Dipstick (18997) (Completed)   Culture, Urine    Return in about 2 weeks (around 05/30/2024).   Total time spent: 25 minutes  [employer], NP  05/16/2024   This document may have been prepared by Dragon Voice Recognition software and as such may include unintentional dictation errors.

## 2024-05-18 LAB — URINE CULTURE

## 2024-05-18 NOTE — Progress Notes (Signed)
 Pt informed and said she feels 100% better.

## 2024-05-24 ENCOUNTER — Telehealth: Payer: Self-pay | Admitting: Cardiology

## 2024-05-24 NOTE — Telephone Encounter (Signed)
 PT came to office to get me to relay the message that she wants a cardiac scoring done at [facility] please Thank you

## 2024-05-25 ENCOUNTER — Other Ambulatory Visit: Payer: Self-pay | Admitting: Cardiology

## 2024-05-25 DIAGNOSIS — E782 Mixed hyperlipidemia: Secondary | ICD-10-CM

## 2024-05-30 ENCOUNTER — Ambulatory Visit: Admitting: Cardiology

## 2024-06-01 ENCOUNTER — Ambulatory Visit
Admission: RE | Admit: 2024-06-01 | Discharge: 2024-06-01 | Disposition: A | Discharge status: Home / Self Care | Payer: Self-pay | Source: Ambulatory Visit | Attending: Cardiology | Admitting: Cardiology

## 2024-06-01 DIAGNOSIS — E782 Mixed hyperlipidemia: Secondary | ICD-10-CM | POA: Insufficient documentation

## 2024-06-02 ENCOUNTER — Ambulatory Visit: Payer: Self-pay | Admitting: Cardiology

## 2024-06-29 ENCOUNTER — Other Ambulatory Visit: Payer: Self-pay | Admitting: Cardiology

## 2024-06-29 DIAGNOSIS — Z1231 Encounter for screening mammogram for malignant neoplasm of breast: Secondary | ICD-10-CM

## 2024-07-14 ENCOUNTER — Ambulatory Visit
Admission: RE | Admit: 2024-07-14 | Discharge: 2024-07-14 | Disposition: A | Discharge status: Home / Self Care | Source: Ambulatory Visit | Attending: Cardiology | Admitting: Cardiology

## 2024-07-14 DIAGNOSIS — Z1231 Encounter for screening mammogram for malignant neoplasm of breast: Secondary | ICD-10-CM | POA: Insufficient documentation

## 2024-07-25 ENCOUNTER — Other Ambulatory Visit: Payer: Self-pay | Admitting: Cardiology

## 2025-04-27 ENCOUNTER — Encounter: Admitting: Cardiology
